# Patient Record
Sex: Female | Born: 1940 | Race: White | Hispanic: No | Marital: Married | Smoking: Never smoker
Health system: Southern US, Community
[De-identification: ages and names within clinical notes are randomized; demographics above are authoritative.]

## PROBLEM LIST (undated history)

## (undated) DIAGNOSIS — N289 Disorder of kidney and ureter, unspecified: Secondary | ICD-10-CM

## (undated) DIAGNOSIS — E119 Type 2 diabetes mellitus without complications: Secondary | ICD-10-CM

## (undated) DIAGNOSIS — K219 Gastro-esophageal reflux disease without esophagitis: Secondary | ICD-10-CM

## (undated) DIAGNOSIS — K760 Fatty (change of) liver, not elsewhere classified: Secondary | ICD-10-CM

## (undated) DIAGNOSIS — T753XXA Motion sickness, initial encounter: Secondary | ICD-10-CM

## (undated) DIAGNOSIS — Z9011 Acquired absence of right breast and nipple: Secondary | ICD-10-CM

## (undated) DIAGNOSIS — R42 Dizziness and giddiness: Secondary | ICD-10-CM

## (undated) DIAGNOSIS — I499 Cardiac arrhythmia, unspecified: Secondary | ICD-10-CM

## (undated) DIAGNOSIS — IMO0002 Reserved for concepts with insufficient information to code with codable children: Secondary | ICD-10-CM

## (undated) DIAGNOSIS — Z923 Personal history of irradiation: Secondary | ICD-10-CM

## (undated) DIAGNOSIS — I10 Essential (primary) hypertension: Secondary | ICD-10-CM

## (undated) DIAGNOSIS — C649 Malignant neoplasm of unspecified kidney, except renal pelvis: Secondary | ICD-10-CM

## (undated) DIAGNOSIS — E7800 Pure hypercholesterolemia, unspecified: Secondary | ICD-10-CM

## (undated) DIAGNOSIS — R160 Hepatomegaly, not elsewhere classified: Secondary | ICD-10-CM

## (undated) DIAGNOSIS — R059 Cough, unspecified: Secondary | ICD-10-CM

## (undated) DIAGNOSIS — K5790 Diverticulosis of intestine, part unspecified, without perforation or abscess without bleeding: Secondary | ICD-10-CM

## (undated) DIAGNOSIS — R05 Cough: Secondary | ICD-10-CM

## (undated) DIAGNOSIS — E215 Disorder of parathyroid gland, unspecified: Secondary | ICD-10-CM

## (undated) DIAGNOSIS — K589 Irritable bowel syndrome without diarrhea: Secondary | ICD-10-CM

## (undated) DIAGNOSIS — Z8719 Personal history of other diseases of the digestive system: Secondary | ICD-10-CM

## (undated) DIAGNOSIS — R002 Palpitations: Secondary | ICD-10-CM

## (undated) DIAGNOSIS — D49519 Neoplasm of unspecified behavior of unspecified kidney: Secondary | ICD-10-CM

## (undated) DIAGNOSIS — C50919 Malignant neoplasm of unspecified site of unspecified female breast: Secondary | ICD-10-CM

## (undated) HISTORY — DX: Disorder of kidney and ureter, unspecified: N28.9

## (undated) HISTORY — DX: Malignant neoplasm of unspecified kidney, except renal pelvis: C64.9

## (undated) HISTORY — DX: Type 2 diabetes mellitus without complications: E11.9

## (undated) HISTORY — PX: APPENDECTOMY: SHX54

## (undated) HISTORY — PX: TONSILLECTOMY: SUR1361

## (undated) HISTORY — DX: Pure hypercholesterolemia, unspecified: E78.00

## (undated) HISTORY — PX: EYE SURGERY: SHX253

## (undated) HISTORY — PX: LEG SURGERY: SHX1003

## (undated) HISTORY — PX: CHOLECYSTECTOMY: SHX55

## (undated) HISTORY — DX: Essential (primary) hypertension: I10

## (undated) HISTORY — DX: Diverticulosis of intestine, part unspecified, without perforation or abscess without bleeding: K57.90

---

## 2003-06-20 HISTORY — PX: OTHER SURGICAL HISTORY: SHX169

## 2005-01-31 ENCOUNTER — Ambulatory Visit: Payer: Self-pay | Admitting: Unknown Physician Specialty

## 2006-01-11 ENCOUNTER — Ambulatory Visit: Payer: Self-pay | Admitting: Family Medicine

## 2007-01-16 ENCOUNTER — Ambulatory Visit: Payer: Self-pay | Admitting: Family Medicine

## 2008-01-28 ENCOUNTER — Ambulatory Visit: Payer: Self-pay | Admitting: Family Medicine

## 2008-02-11 ENCOUNTER — Ambulatory Visit: Payer: Self-pay | Admitting: Family Medicine

## 2009-01-28 ENCOUNTER — Ambulatory Visit: Payer: Self-pay | Admitting: Family Medicine

## 2009-06-08 ENCOUNTER — Ambulatory Visit: Payer: Self-pay | Admitting: Internal Medicine

## 2009-08-25 ENCOUNTER — Ambulatory Visit: Payer: Self-pay | Admitting: Unknown Physician Specialty

## 2010-02-16 ENCOUNTER — Ambulatory Visit: Payer: Self-pay | Admitting: Internal Medicine

## 2010-02-19 ENCOUNTER — Ambulatory Visit: Payer: Self-pay | Admitting: Internal Medicine

## 2010-09-02 ENCOUNTER — Ambulatory Visit: Payer: Self-pay | Admitting: Internal Medicine

## 2010-11-24 ENCOUNTER — Inpatient Hospital Stay: Payer: Self-pay | Admitting: Orthopedic Surgery

## 2011-07-12 ENCOUNTER — Ambulatory Visit: Payer: Self-pay | Admitting: Internal Medicine

## 2012-03-21 DIAGNOSIS — Z923 Personal history of irradiation: Secondary | ICD-10-CM

## 2012-03-21 DIAGNOSIS — C50919 Malignant neoplasm of unspecified site of unspecified female breast: Secondary | ICD-10-CM

## 2012-03-21 DIAGNOSIS — IMO0001 Reserved for inherently not codable concepts without codable children: Secondary | ICD-10-CM

## 2012-03-21 HISTORY — DX: Personal history of irradiation: Z92.3

## 2012-03-21 HISTORY — PX: BREAST LUMPECTOMY: SHX2

## 2012-03-21 HISTORY — DX: Malignant neoplasm of unspecified site of unspecified female breast: C50.919

## 2012-03-21 HISTORY — DX: Reserved for inherently not codable concepts without codable children: IMO0001

## 2012-04-29 ENCOUNTER — Emergency Department: Payer: Self-pay | Admitting: Emergency Medicine

## 2012-05-18 ENCOUNTER — Ambulatory Visit: Payer: Self-pay

## 2012-06-07 ENCOUNTER — Ambulatory Visit: Payer: Self-pay

## 2012-07-16 ENCOUNTER — Ambulatory Visit: Payer: Self-pay

## 2012-07-17 ENCOUNTER — Ambulatory Visit: Payer: Self-pay

## 2012-08-02 ENCOUNTER — Ambulatory Visit: Payer: Self-pay | Admitting: Surgery

## 2012-08-14 ENCOUNTER — Ambulatory Visit: Payer: Self-pay | Admitting: Surgery

## 2012-08-15 LAB — PATHOLOGY REPORT

## 2012-08-20 ENCOUNTER — Ambulatory Visit: Payer: Self-pay | Admitting: Surgery

## 2012-08-20 HISTORY — PX: BREAST EXCISIONAL BIOPSY: SUR124

## 2012-08-23 LAB — PATHOLOGY REPORT

## 2012-09-03 ENCOUNTER — Ambulatory Visit: Payer: Self-pay | Admitting: Oncology

## 2012-09-18 ENCOUNTER — Ambulatory Visit: Payer: Self-pay | Admitting: Oncology

## 2012-09-27 LAB — CBC CANCER CENTER
Basophil #: 0 x10 3/mm (ref 0.0–0.1)
HCT: 42.2 % (ref 35.0–47.0)
HGB: 14.3 g/dL (ref 12.0–16.0)
Lymphocyte #: 1.8 x10 3/mm (ref 1.0–3.6)
MCH: 26.7 pg (ref 26.0–34.0)
Monocyte %: 5.2 %
Neutrophil %: 66.9 %
Platelet: 191 x10 3/mm (ref 150–440)
RBC: 5.35 10*6/uL — ABNORMAL HIGH (ref 3.80–5.20)

## 2012-10-04 LAB — CBC CANCER CENTER
Eosinophil #: 0.2 x10 3/mm (ref 0.0–0.7)
HGB: 14.1 g/dL (ref 12.0–16.0)
MCH: 26.2 pg (ref 26.0–34.0)
MCHC: 32.9 g/dL (ref 32.0–36.0)
MCV: 80 fL (ref 80–100)
Monocyte #: 0.4 x10 3/mm (ref 0.2–0.9)
Neutrophil #: 5 x10 3/mm (ref 1.4–6.5)
RBC: 5.38 10*6/uL — ABNORMAL HIGH (ref 3.80–5.20)
RDW: 16.2 % — ABNORMAL HIGH (ref 11.5–14.5)

## 2012-10-11 LAB — CBC CANCER CENTER
Basophil #: 0 x10 3/mm (ref 0.0–0.1)
Basophil %: 0.5 %
Eosinophil #: 0.2 x10 3/mm (ref 0.0–0.7)
HCT: 43.1 % (ref 35.0–47.0)
Lymphocyte #: 1.6 x10 3/mm (ref 1.0–3.6)
MCHC: 34 g/dL (ref 32.0–36.0)
MCV: 80 fL (ref 80–100)
Monocyte #: 0.4 x10 3/mm (ref 0.2–0.9)
Monocyte %: 5.7 %
Neutrophil #: 5 x10 3/mm (ref 1.4–6.5)
Neutrophil %: 68.3 %
Platelet: 177 x10 3/mm (ref 150–440)
RDW: 16.5 % — ABNORMAL HIGH (ref 11.5–14.5)
WBC: 7.3 x10 3/mm (ref 3.6–11.0)

## 2012-10-18 LAB — CBC CANCER CENTER
Basophil %: 0.5 %
Eosinophil #: 0.3 x10 3/mm (ref 0.0–0.7)
Lymphocyte #: 1.3 x10 3/mm (ref 1.0–3.6)
Lymphocyte %: 19.9 %
MCH: 27 pg (ref 26.0–34.0)
MCV: 80 fL (ref 80–100)
Monocyte %: 6.5 %
RBC: 5.38 10*6/uL — ABNORMAL HIGH (ref 3.80–5.20)
WBC: 6.5 x10 3/mm (ref 3.6–11.0)

## 2012-10-19 ENCOUNTER — Ambulatory Visit: Payer: Self-pay | Admitting: Oncology

## 2012-10-26 LAB — CBC CANCER CENTER
Eosinophil #: 0.2 x10 3/mm (ref 0.0–0.7)
HCT: 43.3 % (ref 35.0–47.0)
HGB: 14.6 g/dL (ref 12.0–16.0)
Lymphocyte #: 1.5 x10 3/mm (ref 1.0–3.6)
Lymphocyte %: 20.5 %
MCH: 27 pg (ref 26.0–34.0)
MCHC: 33.6 g/dL (ref 32.0–36.0)
MCV: 80 fL (ref 80–100)
Monocyte %: 5.3 %
Neutrophil #: 5.2 x10 3/mm (ref 1.4–6.5)
Neutrophil %: 70.8 %
RDW: 16.4 % — ABNORMAL HIGH (ref 11.5–14.5)

## 2012-11-01 LAB — CBC CANCER CENTER
Basophil #: 0.1 x10 3/mm (ref 0.0–0.1)
Basophil %: 0.7 %
HGB: 14.9 g/dL (ref 12.0–16.0)
Lymphocyte %: 18.2 %
MCH: 26.8 pg (ref 26.0–34.0)
MCHC: 33.6 g/dL (ref 32.0–36.0)
MCV: 80 fL (ref 80–100)
Monocyte #: 0.4 x10 3/mm (ref 0.2–0.9)
Monocyte %: 5.9 %
Neutrophil %: 72.7 %
Platelet: 183 x10 3/mm (ref 150–440)
RBC: 5.55 10*6/uL — ABNORMAL HIGH (ref 3.80–5.20)
RDW: 16.4 % — ABNORMAL HIGH (ref 11.5–14.5)

## 2012-11-08 LAB — CBC CANCER CENTER
Basophil #: 0 x10 3/mm (ref 0.0–0.1)
Eosinophil #: 0.2 x10 3/mm (ref 0.0–0.7)
Eosinophil %: 3 %
HCT: 43.7 % (ref 35.0–47.0)
HGB: 14.6 g/dL (ref 12.0–16.0)
Lymphocyte %: 21.5 %
MCH: 26.6 pg (ref 26.0–34.0)
MCHC: 33.3 g/dL (ref 32.0–36.0)
MCV: 80 fL (ref 80–100)
Monocyte #: 0.4 x10 3/mm (ref 0.2–0.9)
Monocyte %: 6.3 %
Neutrophil %: 68.6 %
RBC: 5.47 10*6/uL — ABNORMAL HIGH (ref 3.80–5.20)
RDW: 15.9 % — ABNORMAL HIGH (ref 11.5–14.5)
WBC: 6.5 x10 3/mm (ref 3.6–11.0)

## 2012-11-19 ENCOUNTER — Ambulatory Visit: Payer: Self-pay | Admitting: Oncology

## 2013-02-07 ENCOUNTER — Ambulatory Visit: Payer: Self-pay | Admitting: Oncology

## 2013-02-18 ENCOUNTER — Ambulatory Visit: Payer: Self-pay | Admitting: Oncology

## 2013-04-22 DIAGNOSIS — Z85528 Personal history of other malignant neoplasm of kidney: Secondary | ICD-10-CM | POA: Insufficient documentation

## 2013-05-08 ENCOUNTER — Ambulatory Visit: Payer: Self-pay | Admitting: Radiation Oncology

## 2013-05-08 DIAGNOSIS — Z79810 Long term (current) use of selective estrogen receptor modulators (SERMs): Secondary | ICD-10-CM | POA: Diagnosis not present

## 2013-05-08 DIAGNOSIS — D0590 Unspecified type of carcinoma in situ of unspecified breast: Secondary | ICD-10-CM | POA: Diagnosis not present

## 2013-05-08 DIAGNOSIS — Z170 Estrogen receptor positive status [ER+]: Secondary | ICD-10-CM | POA: Diagnosis not present

## 2013-05-19 ENCOUNTER — Ambulatory Visit: Payer: Self-pay | Admitting: Radiation Oncology

## 2013-06-22 DIAGNOSIS — R21 Rash and other nonspecific skin eruption: Secondary | ICD-10-CM | POA: Diagnosis not present

## 2013-07-03 DIAGNOSIS — L509 Urticaria, unspecified: Secondary | ICD-10-CM | POA: Diagnosis not present

## 2013-07-17 ENCOUNTER — Ambulatory Visit: Payer: Self-pay | Admitting: Oncology

## 2013-07-17 DIAGNOSIS — Z853 Personal history of malignant neoplasm of breast: Secondary | ICD-10-CM | POA: Diagnosis not present

## 2013-07-17 DIAGNOSIS — R922 Inconclusive mammogram: Secondary | ICD-10-CM | POA: Diagnosis not present

## 2013-07-17 DIAGNOSIS — R928 Other abnormal and inconclusive findings on diagnostic imaging of breast: Secondary | ICD-10-CM | POA: Diagnosis not present

## 2013-07-19 ENCOUNTER — Ambulatory Visit: Payer: Self-pay | Admitting: Oncology

## 2013-07-19 DIAGNOSIS — Z79810 Long term (current) use of selective estrogen receptor modulators (SERMs): Secondary | ICD-10-CM | POA: Diagnosis not present

## 2013-07-19 DIAGNOSIS — D0590 Unspecified type of carcinoma in situ of unspecified breast: Secondary | ICD-10-CM | POA: Diagnosis not present

## 2013-07-24 DIAGNOSIS — D0590 Unspecified type of carcinoma in situ of unspecified breast: Secondary | ICD-10-CM | POA: Diagnosis not present

## 2013-08-19 ENCOUNTER — Ambulatory Visit: Payer: Self-pay | Admitting: Oncology

## 2013-08-23 DIAGNOSIS — H353190 Nonexudative age-related macular degeneration, unspecified eye, stage unspecified: Secondary | ICD-10-CM | POA: Diagnosis not present

## 2013-10-03 ENCOUNTER — Ambulatory Visit: Payer: Self-pay | Admitting: Oncology

## 2013-10-04 DIAGNOSIS — D0590 Unspecified type of carcinoma in situ of unspecified breast: Secondary | ICD-10-CM | POA: Diagnosis not present

## 2013-10-08 DIAGNOSIS — E119 Type 2 diabetes mellitus without complications: Secondary | ICD-10-CM | POA: Diagnosis not present

## 2013-10-08 DIAGNOSIS — E785 Hyperlipidemia, unspecified: Secondary | ICD-10-CM | POA: Diagnosis not present

## 2013-10-08 DIAGNOSIS — Z0000 Encounter for general adult medical examination without abnormal findings: Secondary | ICD-10-CM | POA: Diagnosis not present

## 2013-10-08 DIAGNOSIS — I10 Essential (primary) hypertension: Secondary | ICD-10-CM | POA: Diagnosis not present

## 2013-10-16 DIAGNOSIS — K5730 Diverticulosis of large intestine without perforation or abscess without bleeding: Secondary | ICD-10-CM | POA: Diagnosis not present

## 2013-10-16 DIAGNOSIS — E785 Hyperlipidemia, unspecified: Secondary | ICD-10-CM | POA: Diagnosis not present

## 2013-10-16 DIAGNOSIS — Z853 Personal history of malignant neoplasm of breast: Secondary | ICD-10-CM | POA: Diagnosis not present

## 2013-10-16 DIAGNOSIS — Z0000 Encounter for general adult medical examination without abnormal findings: Secondary | ICD-10-CM | POA: Diagnosis not present

## 2013-10-16 DIAGNOSIS — N183 Chronic kidney disease, stage 3 unspecified: Secondary | ICD-10-CM | POA: Diagnosis not present

## 2013-11-09 DIAGNOSIS — J069 Acute upper respiratory infection, unspecified: Secondary | ICD-10-CM | POA: Diagnosis not present

## 2013-11-29 ENCOUNTER — Ambulatory Visit: Payer: Self-pay | Admitting: Oncology

## 2013-11-29 DIAGNOSIS — Z833 Family history of diabetes mellitus: Secondary | ICD-10-CM | POA: Diagnosis not present

## 2013-11-29 DIAGNOSIS — D0590 Unspecified type of carcinoma in situ of unspecified breast: Secondary | ICD-10-CM | POA: Diagnosis not present

## 2013-11-29 DIAGNOSIS — R209 Unspecified disturbances of skin sensation: Secondary | ICD-10-CM | POA: Diagnosis not present

## 2013-11-29 DIAGNOSIS — E119 Type 2 diabetes mellitus without complications: Secondary | ICD-10-CM | POA: Diagnosis not present

## 2013-11-29 DIAGNOSIS — Z9089 Acquired absence of other organs: Secondary | ICD-10-CM | POA: Diagnosis not present

## 2013-11-29 DIAGNOSIS — N289 Disorder of kidney and ureter, unspecified: Secondary | ICD-10-CM | POA: Diagnosis not present

## 2013-11-29 DIAGNOSIS — E785 Hyperlipidemia, unspecified: Secondary | ICD-10-CM | POA: Diagnosis not present

## 2013-11-29 DIAGNOSIS — I10 Essential (primary) hypertension: Secondary | ICD-10-CM | POA: Diagnosis not present

## 2013-11-29 DIAGNOSIS — K5730 Diverticulosis of large intestine without perforation or abscess without bleeding: Secondary | ICD-10-CM | POA: Diagnosis not present

## 2013-11-29 DIAGNOSIS — Z905 Acquired absence of kidney: Secondary | ICD-10-CM | POA: Diagnosis not present

## 2013-11-29 DIAGNOSIS — Z8553 Personal history of malignant neoplasm of renal pelvis: Secondary | ICD-10-CM | POA: Diagnosis not present

## 2013-11-29 DIAGNOSIS — Z79810 Long term (current) use of selective estrogen receptor modulators (SERMs): Secondary | ICD-10-CM | POA: Diagnosis not present

## 2013-11-29 DIAGNOSIS — Z79899 Other long term (current) drug therapy: Secondary | ICD-10-CM | POA: Diagnosis not present

## 2013-12-02 DIAGNOSIS — Z79899 Other long term (current) drug therapy: Secondary | ICD-10-CM | POA: Diagnosis not present

## 2013-12-02 DIAGNOSIS — I10 Essential (primary) hypertension: Secondary | ICD-10-CM | POA: Diagnosis not present

## 2013-12-02 DIAGNOSIS — D0590 Unspecified type of carcinoma in situ of unspecified breast: Secondary | ICD-10-CM | POA: Diagnosis not present

## 2013-12-02 DIAGNOSIS — Z79810 Long term (current) use of selective estrogen receptor modulators (SERMs): Secondary | ICD-10-CM | POA: Diagnosis not present

## 2013-12-02 DIAGNOSIS — E119 Type 2 diabetes mellitus without complications: Secondary | ICD-10-CM | POA: Diagnosis not present

## 2013-12-02 DIAGNOSIS — R209 Unspecified disturbances of skin sensation: Secondary | ICD-10-CM | POA: Diagnosis not present

## 2013-12-19 ENCOUNTER — Ambulatory Visit: Payer: Self-pay | Admitting: Oncology

## 2013-12-23 DIAGNOSIS — H35341 Macular cyst, hole, or pseudohole, right eye: Secondary | ICD-10-CM | POA: Diagnosis not present

## 2014-01-01 DIAGNOSIS — N816 Rectocele: Secondary | ICD-10-CM | POA: Insufficient documentation

## 2014-01-01 DIAGNOSIS — Z859 Personal history of malignant neoplasm, unspecified: Secondary | ICD-10-CM | POA: Diagnosis not present

## 2014-01-01 DIAGNOSIS — Z01419 Encounter for gynecological examination (general) (routine) without abnormal findings: Secondary | ICD-10-CM | POA: Diagnosis not present

## 2014-01-08 DIAGNOSIS — H35341 Macular cyst, hole, or pseudohole, right eye: Secondary | ICD-10-CM | POA: Diagnosis not present

## 2014-01-10 ENCOUNTER — Ambulatory Visit: Payer: Self-pay | Admitting: Ophthalmology

## 2014-01-10 DIAGNOSIS — Z1211 Encounter for screening for malignant neoplasm of colon: Secondary | ICD-10-CM | POA: Diagnosis not present

## 2014-01-10 DIAGNOSIS — Z853 Personal history of malignant neoplasm of breast: Secondary | ICD-10-CM | POA: Diagnosis not present

## 2014-01-10 DIAGNOSIS — E780 Pure hypercholesterolemia: Secondary | ICD-10-CM | POA: Diagnosis not present

## 2014-01-10 DIAGNOSIS — K219 Gastro-esophageal reflux disease without esophagitis: Secondary | ICD-10-CM | POA: Diagnosis not present

## 2014-01-10 DIAGNOSIS — Z01810 Encounter for preprocedural cardiovascular examination: Secondary | ICD-10-CM | POA: Diagnosis not present

## 2014-01-10 DIAGNOSIS — E119 Type 2 diabetes mellitus without complications: Secondary | ICD-10-CM | POA: Diagnosis not present

## 2014-01-10 DIAGNOSIS — N183 Chronic kidney disease, stage 3 (moderate): Secondary | ICD-10-CM | POA: Diagnosis not present

## 2014-01-10 DIAGNOSIS — I10 Essential (primary) hypertension: Secondary | ICD-10-CM | POA: Diagnosis not present

## 2014-01-10 DIAGNOSIS — J209 Acute bronchitis, unspecified: Secondary | ICD-10-CM | POA: Diagnosis not present

## 2014-01-10 DIAGNOSIS — Z01812 Encounter for preprocedural laboratory examination: Secondary | ICD-10-CM | POA: Diagnosis not present

## 2014-01-10 DIAGNOSIS — H35341 Macular cyst, hole, or pseudohole, right eye: Secondary | ICD-10-CM | POA: Diagnosis not present

## 2014-01-10 LAB — POTASSIUM: Potassium: 4.1 mmol/L (ref 3.5–5.1)

## 2014-01-15 ENCOUNTER — Ambulatory Visit: Payer: Self-pay | Admitting: Ophthalmology

## 2014-01-15 DIAGNOSIS — Z9011 Acquired absence of right breast and nipple: Secondary | ICD-10-CM | POA: Diagnosis not present

## 2014-01-15 DIAGNOSIS — H35341 Macular cyst, hole, or pseudohole, right eye: Secondary | ICD-10-CM | POA: Diagnosis not present

## 2014-01-15 DIAGNOSIS — K449 Diaphragmatic hernia without obstruction or gangrene: Secondary | ICD-10-CM | POA: Diagnosis not present

## 2014-01-15 DIAGNOSIS — Z79899 Other long term (current) drug therapy: Secondary | ICD-10-CM | POA: Diagnosis not present

## 2014-01-15 DIAGNOSIS — Z885 Allergy status to narcotic agent status: Secondary | ICD-10-CM | POA: Diagnosis not present

## 2014-01-15 DIAGNOSIS — E119 Type 2 diabetes mellitus without complications: Secondary | ICD-10-CM | POA: Diagnosis not present

## 2014-01-15 DIAGNOSIS — I10 Essential (primary) hypertension: Secondary | ICD-10-CM | POA: Diagnosis not present

## 2014-01-15 DIAGNOSIS — Z888 Allergy status to other drugs, medicaments and biological substances status: Secondary | ICD-10-CM | POA: Diagnosis not present

## 2014-01-15 DIAGNOSIS — K219 Gastro-esophageal reflux disease without esophagitis: Secondary | ICD-10-CM | POA: Diagnosis not present

## 2014-01-15 DIAGNOSIS — Z8553 Personal history of malignant neoplasm of renal pelvis: Secondary | ICD-10-CM | POA: Diagnosis not present

## 2014-01-15 DIAGNOSIS — Z853 Personal history of malignant neoplasm of breast: Secondary | ICD-10-CM | POA: Diagnosis not present

## 2014-01-20 ENCOUNTER — Ambulatory Visit: Payer: Self-pay | Admitting: Oncology

## 2014-01-20 DIAGNOSIS — R921 Mammographic calcification found on diagnostic imaging of breast: Secondary | ICD-10-CM | POA: Diagnosis not present

## 2014-01-20 DIAGNOSIS — R928 Other abnormal and inconclusive findings on diagnostic imaging of breast: Secondary | ICD-10-CM | POA: Diagnosis not present

## 2014-01-24 ENCOUNTER — Ambulatory Visit: Payer: Self-pay | Admitting: Oncology

## 2014-01-24 DIAGNOSIS — Z79899 Other long term (current) drug therapy: Secondary | ICD-10-CM | POA: Diagnosis not present

## 2014-01-24 DIAGNOSIS — E119 Type 2 diabetes mellitus without complications: Secondary | ICD-10-CM | POA: Diagnosis not present

## 2014-01-24 DIAGNOSIS — Z1231 Encounter for screening mammogram for malignant neoplasm of breast: Secondary | ICD-10-CM | POA: Diagnosis not present

## 2014-01-24 DIAGNOSIS — Z905 Acquired absence of kidney: Secondary | ICD-10-CM | POA: Diagnosis not present

## 2014-01-24 DIAGNOSIS — K5790 Diverticulosis of intestine, part unspecified, without perforation or abscess without bleeding: Secondary | ICD-10-CM | POA: Diagnosis not present

## 2014-01-24 DIAGNOSIS — Z23 Encounter for immunization: Secondary | ICD-10-CM | POA: Diagnosis not present

## 2014-01-24 DIAGNOSIS — Z85528 Personal history of other malignant neoplasm of kidney: Secondary | ICD-10-CM | POA: Diagnosis not present

## 2014-01-24 DIAGNOSIS — N189 Chronic kidney disease, unspecified: Secondary | ICD-10-CM | POA: Diagnosis not present

## 2014-01-24 DIAGNOSIS — Z79810 Long term (current) use of selective estrogen receptor modulators (SERMs): Secondary | ICD-10-CM | POA: Diagnosis not present

## 2014-01-24 DIAGNOSIS — D0591 Unspecified type of carcinoma in situ of right breast: Secondary | ICD-10-CM | POA: Diagnosis not present

## 2014-01-24 DIAGNOSIS — I129 Hypertensive chronic kidney disease with stage 1 through stage 4 chronic kidney disease, or unspecified chronic kidney disease: Secondary | ICD-10-CM | POA: Diagnosis not present

## 2014-01-24 DIAGNOSIS — Z9049 Acquired absence of other specified parts of digestive tract: Secondary | ICD-10-CM | POA: Diagnosis not present

## 2014-02-18 ENCOUNTER — Ambulatory Visit: Payer: Self-pay | Admitting: Oncology

## 2014-02-21 DIAGNOSIS — H35341 Macular cyst, hole, or pseudohole, right eye: Secondary | ICD-10-CM | POA: Diagnosis not present

## 2014-03-27 ENCOUNTER — Ambulatory Visit: Payer: Self-pay | Admitting: Oncology

## 2014-03-27 DIAGNOSIS — Z1231 Encounter for screening mammogram for malignant neoplasm of breast: Secondary | ICD-10-CM | POA: Diagnosis not present

## 2014-03-27 DIAGNOSIS — E785 Hyperlipidemia, unspecified: Secondary | ICD-10-CM | POA: Diagnosis not present

## 2014-03-27 DIAGNOSIS — Z79810 Long term (current) use of selective estrogen receptor modulators (SERMs): Secondary | ICD-10-CM | POA: Diagnosis not present

## 2014-03-27 DIAGNOSIS — Z9049 Acquired absence of other specified parts of digestive tract: Secondary | ICD-10-CM | POA: Diagnosis not present

## 2014-03-27 DIAGNOSIS — N189 Chronic kidney disease, unspecified: Secondary | ICD-10-CM | POA: Diagnosis not present

## 2014-03-27 DIAGNOSIS — K5790 Diverticulosis of intestine, part unspecified, without perforation or abscess without bleeding: Secondary | ICD-10-CM | POA: Diagnosis not present

## 2014-03-27 DIAGNOSIS — Z85528 Personal history of other malignant neoplasm of kidney: Secondary | ICD-10-CM | POA: Diagnosis not present

## 2014-03-27 DIAGNOSIS — I10 Essential (primary) hypertension: Secondary | ICD-10-CM | POA: Diagnosis not present

## 2014-03-27 DIAGNOSIS — E119 Type 2 diabetes mellitus without complications: Secondary | ICD-10-CM | POA: Diagnosis not present

## 2014-03-27 DIAGNOSIS — D0510 Intraductal carcinoma in situ of unspecified breast: Secondary | ICD-10-CM | POA: Diagnosis not present

## 2014-03-27 DIAGNOSIS — I129 Hypertensive chronic kidney disease with stage 1 through stage 4 chronic kidney disease, or unspecified chronic kidney disease: Secondary | ICD-10-CM | POA: Diagnosis not present

## 2014-03-27 DIAGNOSIS — N63 Unspecified lump in breast: Secondary | ICD-10-CM | POA: Diagnosis not present

## 2014-03-27 DIAGNOSIS — Z905 Acquired absence of kidney: Secondary | ICD-10-CM | POA: Diagnosis not present

## 2014-04-10 DIAGNOSIS — N63 Unspecified lump in breast: Secondary | ICD-10-CM | POA: Diagnosis not present

## 2014-04-10 DIAGNOSIS — Z853 Personal history of malignant neoplasm of breast: Secondary | ICD-10-CM | POA: Diagnosis not present

## 2014-04-10 DIAGNOSIS — Z79810 Long term (current) use of selective estrogen receptor modulators (SERMs): Secondary | ICD-10-CM | POA: Diagnosis not present

## 2014-04-10 DIAGNOSIS — N6459 Other signs and symptoms in breast: Secondary | ICD-10-CM | POA: Diagnosis not present

## 2014-04-10 DIAGNOSIS — E785 Hyperlipidemia, unspecified: Secondary | ICD-10-CM | POA: Diagnosis not present

## 2014-04-10 DIAGNOSIS — D0510 Intraductal carcinoma in situ of unspecified breast: Secondary | ICD-10-CM | POA: Diagnosis not present

## 2014-04-10 DIAGNOSIS — E119 Type 2 diabetes mellitus without complications: Secondary | ICD-10-CM | POA: Diagnosis not present

## 2014-04-10 DIAGNOSIS — I10 Essential (primary) hypertension: Secondary | ICD-10-CM | POA: Diagnosis not present

## 2014-04-16 DIAGNOSIS — E119 Type 2 diabetes mellitus without complications: Secondary | ICD-10-CM | POA: Diagnosis not present

## 2014-04-16 DIAGNOSIS — D0591 Unspecified type of carcinoma in situ of right breast: Secondary | ICD-10-CM | POA: Diagnosis not present

## 2014-04-16 DIAGNOSIS — I10 Essential (primary) hypertension: Secondary | ICD-10-CM | POA: Diagnosis not present

## 2014-04-16 DIAGNOSIS — N63 Unspecified lump in breast: Secondary | ICD-10-CM | POA: Diagnosis not present

## 2014-04-16 DIAGNOSIS — E785 Hyperlipidemia, unspecified: Secondary | ICD-10-CM | POA: Diagnosis not present

## 2014-04-16 DIAGNOSIS — D0510 Intraductal carcinoma in situ of unspecified breast: Secondary | ICD-10-CM | POA: Diagnosis not present

## 2014-04-16 DIAGNOSIS — Z79810 Long term (current) use of selective estrogen receptor modulators (SERMs): Secondary | ICD-10-CM | POA: Diagnosis not present

## 2014-04-21 ENCOUNTER — Ambulatory Visit: Payer: Self-pay | Admitting: Oncology

## 2014-04-29 DIAGNOSIS — N183 Chronic kidney disease, stage 3 (moderate): Secondary | ICD-10-CM | POA: Diagnosis not present

## 2014-04-29 DIAGNOSIS — K5790 Diverticulosis of intestine, part unspecified, without perforation or abscess without bleeding: Secondary | ICD-10-CM | POA: Diagnosis not present

## 2014-04-29 DIAGNOSIS — Z853 Personal history of malignant neoplasm of breast: Secondary | ICD-10-CM | POA: Diagnosis not present

## 2014-04-29 DIAGNOSIS — E119 Type 2 diabetes mellitus without complications: Secondary | ICD-10-CM | POA: Diagnosis not present

## 2014-04-29 DIAGNOSIS — E785 Hyperlipidemia, unspecified: Secondary | ICD-10-CM | POA: Diagnosis not present

## 2014-05-06 DIAGNOSIS — I10 Essential (primary) hypertension: Secondary | ICD-10-CM | POA: Diagnosis not present

## 2014-05-06 DIAGNOSIS — E119 Type 2 diabetes mellitus without complications: Secondary | ICD-10-CM | POA: Diagnosis not present

## 2014-05-06 DIAGNOSIS — E785 Hyperlipidemia, unspecified: Secondary | ICD-10-CM | POA: Diagnosis not present

## 2014-06-06 DIAGNOSIS — R05 Cough: Secondary | ICD-10-CM | POA: Diagnosis not present

## 2014-06-06 DIAGNOSIS — B354 Tinea corporis: Secondary | ICD-10-CM | POA: Diagnosis not present

## 2014-06-20 DIAGNOSIS — H35341 Macular cyst, hole, or pseudohole, right eye: Secondary | ICD-10-CM | POA: Diagnosis not present

## 2014-06-20 DIAGNOSIS — H3531 Nonexudative age-related macular degeneration: Secondary | ICD-10-CM | POA: Diagnosis not present

## 2014-07-04 ENCOUNTER — Other Ambulatory Visit: Payer: Self-pay | Admitting: Oncology

## 2014-07-04 DIAGNOSIS — R928 Other abnormal and inconclusive findings on diagnostic imaging of breast: Secondary | ICD-10-CM

## 2014-07-04 DIAGNOSIS — Z853 Personal history of malignant neoplasm of breast: Secondary | ICD-10-CM

## 2014-07-11 DIAGNOSIS — H35341 Macular cyst, hole, or pseudohole, right eye: Secondary | ICD-10-CM | POA: Diagnosis not present

## 2014-07-11 NOTE — Op Note (Signed)
PATIENT NAME:  Gail Mccarthy, Gail Mccarthy MR#:  [id number] DATE OF BIRTH:  31-Aug-1940  DATE OF PROCEDURE:  08/20/2012  PREOPERATIVE DIAGNOSIS: Ductal carcinoma in situ of the right breast.   POSTOPERATIVE DIAGNOSIS: Ductal carcinoma in situ of the right breast.   PROCEDURE: Right partial mastectomy.   SURGEON: Rochel Brome, M.D.   ANESTHESIA: General.   INDICATIONS: This 74 year old female recently had a mammogram detecting a small cluster of microcalcifications in the medial aspect of the right breast. She had stereotactic needle biopsy, which demonstrated ductal carcinoma in situ. She had preoperative insertion of a Kopans wire. I have reviewed the mammogram images with the Kopans wire in place.  DESCRIPTION OF PROCEDURE: The patient was placed on the operating table in the supine position under general anesthesia. The dressing was removed from the right breast, exposing the Kopans wire which was cut 2 cm from the skin. The Kopans wire entered the skin at the 3 o'clock position of the right breast. The breast was prepared with ChloraPrep and draped in a sterile manner.   Mammogram images were reviewed prior to incision.   A curvilinear incision was made at the 3 o'clock position of the right breast, extending from 2 o'clock  to 4 o'clock position, as it was midway between the medial border of the areola and the sternum. The incision was carried down through subcutaneous tissues. I did excise a narrow ellipse of skin, which was the biopsy scar, and extended down to encounter the wire. Next, a sample of tissue surrounding the wire was removed, which was approximately 4 x 4 x 4 cm in dimension and was labeled with MarginMap to insert 2 suture markers to the tissue. Also, the 4 o'clock end of the skin ellipse was tagged with a nylon stitch. The Kopans wire was left intact.   This was sent for specimen mammogram and from there for pathology to check for margins.   The wound was inspected. Several small  bleeding points were cauterized. Hemostasis was subsequently intact. Deeper tissues were infiltrated with 0.5% Sensorcaine with epinephrine, and then subcutaneous tissues were infiltrated as well. The subcutaneous tissues were closed with interrupted 4-0 chromic, and the skin was closed with a running 4-0 Monocryl subcuticular suture.   I waited for the pathologist's call and the pathologist reported margins appeared to be clear, and subsequently the wound was treated with Dermabond and allowed to dry. The patient was then prepared for transfer to the recovery room.    ____________________________ [name]. Rochel Brome, MD [email or web address] D: 08/20/2012 13:26:04 ET T: 08/20/2012 14:48:39 ET JOB#: [id number]  cc: Loreli Dollar, MD, <Dictator> Loreli Dollar MD ELECTRONICALLY SIGNED 08/24/2012 9:30

## 2014-07-11 NOTE — Consult Note (Signed)
Reason for Visit: This 74 year old Female patient presents to the clinic for initial evaluation of  breast cancer .   Referred by Dr. Grayland Ormond.  Diagnosis:  Chief Complaint/Diagnosis   30-year-old female status post wide local excision of the right breast for stage 0 (Tis N0 M0) ductal carcinoma in situ ER/PR positive with clear but close margins.  Pathology Report pathology report reviewed   Imaging Report mammograms reviewed   Referral Report clinical notes reviewed   Planned Treatment Regimen whole breast radiation plus high-dose boost   HPI   patient is a 74 year old femalewho presents with a small nodular density with microcalcifications patient in the right breastwhich was a BI-RADS for suggesting surgical consultation. Underwent needle localization positive for ductal carcinoma in situ.tumor was overall grade 2 and1.6 cm with clear but close margins at 0.2 mm. Tumor was strongly ER/PR positive. Patient tolerated her surgery well. Has been seen by medical oncology will be placed on tamoxifen after radiation. She is seen today and is doing well. She specifically denies breast tenderness cough or bone pain.  Past Hx:    GERD - Esophageal Reflux:    Diverticulitis:    HTN:    Borderline DM:    Cholecystectomy:    Kidney removed: 7 years ago  Past, Family and Social History:  Past Medical History positive   Cardiovascular hyperlipidemia; hypertension   Gastrointestinal diverticulitis; diverticulosis   Genitourinary chronic renal disease   Endocrine diabetes mellitus   Past Surgical History appendectomy; cholecystectomy; right nephrectomy 2005 for renal cell carcinoma   Family History positive   Family History Comments family history of else onset diabetes COPD and colitis.   Social History noncontributory   Additional Past Medical and Surgical History accompanied by her husband today   Allergies:   Codeine: N/V/Diarrhea  Omnicef Omni-Pac:  N/V/Diarrhea  Latex: Rash  Tape: Other  Home Meds:  Home Medications: Medication Instructions Status  benazepril 10 mg oral tablet 1 tab(s) orally once a day in am Active  ALPRAZolam 0.5 mg oral tablet 1 tab(s) orally 3 times a day, As Needed Active  hydrochlorothiazide-triamterene 25 mg-37.5 mg oral tablet 1 tab(s) orally once a day in am Active  pantoprazole 40 mg oral delayed release tablet 1 tab(s) orally once a day, As Needed Active  Tylenol Extra Strength 500 mg oral tablet 2 tab(s) orally as needed for pain. Active   Review of Systems:  General negative   Performance Status (ECOG) 0   Skin negative   Breast see HPI   Ophthalmologic negative   ENMT negative   Respiratory and Thorax negative   Cardiovascular negative   Gastrointestinal negative   Genitourinary negative   Musculoskeletal negative   Neurological negative   Psychiatric negative   Hematology/Lymphatics negative   Endocrine negative   Allergic/Immunologic negative   Review of Systems   according to the nurse's notesPatient denies any weight loss, fatigue, weakness, fever, chills or night sweats. Patient denies any loss of vision, blurred vision. Patient denies any ringing  of the ears or hearing loss. No irregular heartbeat. Patient denies heart murmur or history of fainting. Patient denies any chest pain or pain radiating to her upper extremities. Patient denies any shortness of breath, difficulty breathing at night, cough or hemoptysis. Patient denies any swelling in the lower legs. Patient denies any nausea vomiting, vomiting of blood, or coffee ground material in the vomitus. Patient denies any stomach pain. Patient states has had normal bowel movements no significant constipation or  diarrhea. Patient denies any dysuria, hematuria or significant nocturia. Patient denies any problems walking, swelling in the joints or loss of balance. Patient denies any skin changes, loss of hair or loss of weight.  Patient denies any excessive worrying or anxiety or significant depression. Patient denies any problems with insomnia. Patient denies excessive thirst, polyuria, polydipsia. Patient denies any swollen glands, patient denies easy bruising or easy bleeding. Patient denies any recent infections, allergies or URI. Patient "s visual fields have not changed significantly in recent time.  Nursing Notes:  Nursing Vital Signs and Chemo Nursing Nursing Notes: *CC Vital Signs Flowsheet:   16-Jun-14 09:40  Temp Temperature 95.6  Pulse Pulse 58  Respirations Respirations 16  SBP SBP 149  DBP DBP 79  Pain Scale (0-10)  0  Current Weight (kg) (kg) 105.8  Height (cm) centimeters 165  BSA (m2) 2.1   Physical Exam:  General/Skin/HEENT:  General normal   Skin normal   Eyes normal   ENMT normal   Head and Neck normal   Additional PE well-developed obese female in NAD. Lungs are clear to A&P cardiac examination shows regular rate and rhythm. Right breasthas a wide local excision site which is healing well. No dominant mass or nodularity is noted in either breast into position examined. No axillary or supraclavicular adenopathy is appreciated. Lungs are clear to A&P cardiac examination shows regular rate and rhythm.   Breasts/Resp/CV/GI/GU:  Respiratory and Thorax normal   Cardiovascular normal   Gastrointestinal normal   Genitourinary normal   MS/Neuro/Psych/Lymph:  Musculoskeletal normal   Neurological normal   Lymphatics normal   Other Results:  Radiology Results: LabUnknown:    29-Apr-14 13:32, Digital Additional Views Rt Breast (SCR)  PACS Image   [facility]:  Digital Additional Views Rt Breast (SCR)   REASON FOR EXAM:    av rt nodular density w/microcals  COMMENTS:       PROCEDURE: MAM - MAM DIG ADDVIEWS RT SCR  - Jul 17 2012  1:32PM     RESULT: Comparison made to prior studies dating 01/28/2008. Nodular   density in the inferior medial portion right breast is present.  Surgical   consultation suggested to consider removal. Subtle malignancy cannot be   excluded.    IMPRESSION:  Small nodular density with microcavitation medial right   breast for which surgical consultation suggested as tiny malignancy could   present in this fashion. BI-RADS: Category 4 - Suspicious Abnormality -   Biopsy Should Be Considered  Thank you for the oppurtunity to contribute to the care of your patient.   . may decrease the sensitivity of mammography.     .        Verified By: Osa Craver, M.D., MD   Assessment and Plan: Impression:   ductal carcinoma in situ ER/PR positive status post wide local excision of the right breast in 7-year-old female. Plan:   at the stomach to go ahead with radiation therapy to her right breast. Would plan on delivering 5000 cGy over 5 weeks. Based on extremely close scar would also boost or scar region another 2000 cGy using electron beam. Risks and benefits of treatment were reviewed with the patient and her husband. Side effects including redness itching of the skin, some inclusion of underlying lung parenchyma, alteration of blood counts, and fatigue or lower explained in detail to the patient. They both seem to comprehend my treatment plan well. I have set him up for CT simulation later this week.  I  would like to take this opportunity to thank you for allowing me to continue to participate in this patient's care.  CC Referral:  cc: Dr. Tamala Julian, Dr. Adrian Prows   Electronic Signatures: Baruch Gouty, Roda Shutters (MD)  (Signed 16-Jun-14 15:05)  Authored: HPI, Diagnosis, Past Hx, PFSH, Allergies, Home Meds, ROS, Nursing Notes, Physical Exam, Other Results, Encounter Assessment and Plan, CC Referring Physician   Last Updated: 16-Jun-14 15:05 by Armstead Peaks (MD)

## 2014-07-12 NOTE — Op Note (Signed)
PATIENT NAME:  Gail Mccarthy, Gail Mccarthy MR#:  [id number] DATE OF BIRTH:  June 17, 1940  DATE OF PROCEDURE:  01/15/2014  PREOPERATIVE DIAGNOSIS:  Macular hole, right eye.   POSTOPERATIVE DIAGNOSIS:  Macular hole, right eye.   PROCEDURE:  A 25-gauge pars plana vitrectomy with triamcinolone ICG membrane peel, endolaser, air-fluid exchange, and 24% SF6.   SURGEON:  Laban Emperor. Oval Linsey, MD  COMPLICATIONS:  None.   BLOOD LOSS:  Minimal.   ANESTHESIA:  General.   PROCEDURE NOTE:  The patient was evaluated in the clinic for a macular hole in the right eye. The risks, benefits, alternatives, and complications were discussed. The patient elected to proceed with pars plana vitrectomy with membrane peel on the right eye. On the day of surgery, the patient was greeted in the preoperative holding area. Any questions were answered, and the right eye was marked. The patient was brought in to the operating room in supine position. General anesthesia was administered. The right eye was prepped and draped in the usual sterile fashion. Three 25-gauge trocars were placed in the usual position. The infusion cannula was checked prior to starting the infusion. A core vitrectomy was performed followed by staining the posterior hyaloid with triamcinolone. The posterior hyaloid was then separated, and the remainder of the vitrectomy was performed into the periphery for 360 degrees. ICG was then applied to the retinal surface and aspirated. ILM forceps were then used to peel the ILM for an area of about 2 disc diameters around the macular hole. The periphery was then inspected for 360 degrees using a scleral depressor and the wide-angle lens. There was a small tear found superiorly at 11:00, which was barricaded well with laser, which was extended down past the trocar site on the temporal side. An air-fluid exchange was then performed followed by 4 times the vitreous volume exchange of 24% SF6. The trocars were then removed, and wounds  were sutured and felt to be airtight. The speculum was removed, and the patient was patched and shielded with Neo-Poly-Dex ointment, brought out from general anesthesia, and taken to the recovery area in stable condition.    ____________________________ Laban Emperor Oval Linsey, MD jdr:nb D: 01/15/2014 18:04:43 ET T: 01/15/2014 23:52:56 ET JOB#: [id number]  cc: Janett Billow D. Oval Linsey, MD, <Dictator> Mikaelah Trostle D [facility] MD ELECTRONICALLY SIGNED 01/22/2014 9:02

## 2014-07-21 ENCOUNTER — Ambulatory Visit: Payer: Medicare Other

## 2014-07-21 ENCOUNTER — Ambulatory Visit
Admission: RE | Admit: 2014-07-21 | Discharge: 2014-07-21 | Disposition: A | Discharge status: Home / Self Care | Payer: Medicare Other | Source: Ambulatory Visit | Attending: Oncology | Admitting: Oncology

## 2014-07-21 DIAGNOSIS — R928 Other abnormal and inconclusive findings on diagnostic imaging of breast: Secondary | ICD-10-CM

## 2014-07-21 DIAGNOSIS — Z9889 Other specified postprocedural states: Secondary | ICD-10-CM | POA: Diagnosis not present

## 2014-07-21 DIAGNOSIS — Z853 Personal history of malignant neoplasm of breast: Secondary | ICD-10-CM

## 2014-07-21 HISTORY — DX: Malignant neoplasm of unspecified site of unspecified female breast: C50.919

## 2014-07-21 HISTORY — DX: Reserved for concepts with insufficient information to code with codable children: IMO0002

## 2014-07-24 DIAGNOSIS — H34211 Partial retinal artery occlusion, right eye: Secondary | ICD-10-CM | POA: Diagnosis not present

## 2014-07-30 DIAGNOSIS — H34213 Partial retinal artery occlusion, bilateral: Secondary | ICD-10-CM | POA: Insufficient documentation

## 2014-08-28 ENCOUNTER — Inpatient Hospital Stay: Payer: Medicare Other | Attending: Oncology | Admitting: Oncology

## 2014-08-28 ENCOUNTER — Encounter: Payer: Self-pay | Admitting: Oncology

## 2014-08-28 ENCOUNTER — Encounter (INDEPENDENT_AMBULATORY_CARE_PROVIDER_SITE_OTHER): Payer: Self-pay

## 2014-08-28 VITALS — BP 118/64 | HR 65 | Temp 97.3°F | Resp 20 | Ht 64.96 in | Wt 224.4 lb

## 2014-08-28 DIAGNOSIS — E780 Pure hypercholesterolemia: Secondary | ICD-10-CM | POA: Diagnosis not present

## 2014-08-28 DIAGNOSIS — Z79810 Long term (current) use of selective estrogen receptor modulators (SERMs): Secondary | ICD-10-CM | POA: Insufficient documentation

## 2014-08-28 DIAGNOSIS — D0511 Intraductal carcinoma in situ of right breast: Secondary | ICD-10-CM

## 2014-08-28 DIAGNOSIS — Z85528 Personal history of other malignant neoplasm of kidney: Secondary | ICD-10-CM | POA: Diagnosis not present

## 2014-08-28 DIAGNOSIS — Z7982 Long term (current) use of aspirin: Secondary | ICD-10-CM | POA: Insufficient documentation

## 2014-08-28 DIAGNOSIS — Z9049 Acquired absence of other specified parts of digestive tract: Secondary | ICD-10-CM | POA: Diagnosis not present

## 2014-08-28 DIAGNOSIS — Z79899 Other long term (current) drug therapy: Secondary | ICD-10-CM | POA: Insufficient documentation

## 2014-08-28 DIAGNOSIS — Z803 Family history of malignant neoplasm of breast: Secondary | ICD-10-CM | POA: Insufficient documentation

## 2014-08-28 DIAGNOSIS — Z170 Estrogen receptor positive status [ER+]: Secondary | ICD-10-CM | POA: Diagnosis not present

## 2014-08-28 DIAGNOSIS — K5790 Diverticulosis of intestine, part unspecified, without perforation or abscess without bleeding: Secondary | ICD-10-CM | POA: Diagnosis not present

## 2014-09-03 DIAGNOSIS — K219 Gastro-esophageal reflux disease without esophagitis: Secondary | ICD-10-CM | POA: Diagnosis not present

## 2014-09-03 DIAGNOSIS — Z8719 Personal history of other diseases of the digestive system: Secondary | ICD-10-CM | POA: Diagnosis not present

## 2014-09-03 DIAGNOSIS — Z86010 Personal history of colonic polyps: Secondary | ICD-10-CM | POA: Diagnosis not present

## 2014-09-03 DIAGNOSIS — R1084 Generalized abdominal pain: Secondary | ICD-10-CM | POA: Diagnosis not present

## 2014-09-19 DIAGNOSIS — H35341 Macular cyst, hole, or pseudohole, right eye: Secondary | ICD-10-CM | POA: Diagnosis not present

## 2014-09-21 DIAGNOSIS — D0511 Intraductal carcinoma in situ of right breast: Secondary | ICD-10-CM | POA: Insufficient documentation

## 2014-09-21 NOTE — Progress Notes (Signed)
[place]  [phone] [phone]  ID: Gail Mccarthy OB: [birth year]  MR#: [id number]  AOZ#:[id number]  Patient Care Team: Adrian Prows, MD as PCP - General (Infectious Diseases)  CHIEF COMPLAINT:  Chief Complaint  Patient presents with  . Follow-up    breast cancer    INTERVAL HISTORY: Patient returns to clinic today for routine six-month follow-up. She continues to tolerate tamoxifen. She has no neurologic complaints.  She denies any recent fevers or illnesses.  She has a good appetite and denies weight loss.  She denies any chest pain or shortness of breath.  She denies any nausea, vomiting, constipation, or diarrhea.  She has no urinary complaints.  Patient offers no specific complaints today.   REVIEW OF SYSTEMS:   Review of Systems  Constitutional: Negative.     As per HPI. Otherwise, a complete review of systems is negatve.  PAST MEDICAL HISTORY: Past Medical History  Diagnosis Date  . Radiation 2014  . Breast cancer 2014    lumpectomy  . Diabetes mellitus without complication   . Hypertension   . High cholesterol   . Renal cell carcinoma   . Renal insufficiency   . Diverticulosis     PAST SURGICAL HISTORY: Past Surgical History  Procedure Laterality Date  . Breast biopsy Right 2014  . Appendectomy    . Cholecystectomy    . Tonsillectomy      FAMILY HISTORY Family History  Problem Relation Age of Onset  . Breast cancer Maternal Aunt   . Diabetes Other   . COPD Other   . Colitis Other        ADVANCED DIRECTIVES:    HEALTH MAINTENANCE: History  Substance Use Topics  . Smoking status: Not on file  . Smokeless tobacco: Not on file  . Alcohol Use: Not on file     Colonoscopy:  PAP:  Bone density:  Lipid panel:  Not on File  Current Outpatient Prescriptions  Medication Sig Dispense Refill  . acetaminophen (TYLENOL) 500 MG tablet Take 1,000 mg by mouth every 6 (six) hours as needed.    Marland Kitchen  aspirin 81 MG tablet Take 81 mg by mouth daily.    . benazepril (LOTENSIN) 10 MG tablet Take 10 mg by mouth daily.    . Multiple Vitamins-Minerals (PRESERVISION AREDS) TABS Take 60 mg by mouth daily.    . pantoprazole (PROTONIX) 40 MG tablet Take 40 mg by mouth daily.    . tamoxifen (NOLVADEX) 20 MG tablet Take 20 mg by mouth daily.    Marland Kitchen triamterene-hydrochlorothiazide (MAXZIDE-25) 37.5-25 MG per tablet Take 1 tablet by mouth daily.     No current facility-administered medications for this visit.    OBJECTIVE: Filed Vitals:   08/28/14 1628  BP: 118/64  Pulse: 65  Temp: 97.3 F (36.3 C)  Resp: 20     Body mass index is 37.39 kg/(m^2).    ECOG FS:0 - Asymptomatic  General: Well-developed, well-nourished, no acute distress. Eyes: anicteric sclera. Breasts: Bilateral breast and axilla without lumps or masses. Lungs: Clear to auscultation bilaterally. Heart: Regular rate and rhythm. No rubs, murmurs, or gallops. Abdomen: Soft, nontender, nondistended. No organomegaly noted, normoactive bowel sounds. Musculoskeletal: No edema, cyanosis, or clubbing. Neuro: Alert, answering all questions appropriately. Cranial nerves grossly intact. Skin: No rashes or petechiae noted. Psych: Normal affect.  LAB RESULTS:  Lab Results  Component Value Date   K 4.1 01/10/2014    Lab Results  Component Value Date   WBC  6.5 11/08/2012   NEUTROABS 4.5 11/08/2012   HGB 14.6 11/08/2012   HCT 43.7 11/08/2012   MCV 80 11/08/2012   PLT 195 11/08/2012     STUDIES: No results found.  ASSESSMENT: DCIS, right breast.  PLAN:    1.  DCIS: No evidence of disease.  Continue tamoxifen, completing in August 2019. She had a right breast mammogram on April 10, 2014 that was reported as BI-RADS 2. No intervention is needed at this time. Return to clinic in 6 months for routine evaluation.   Patient expressed understanding and was in agreement with this plan. She also understands that She can call clinic  at any time with any questions, concerns, or complaints.   DCIS (ductal carcinoma in situ)   Staging form: Breast, AJCC 7th Edition     Clinical stage from 09/21/2014: Stage 0 (Tis (DCIS), N0, M0) - Signed by Lloyd Huger, MD on 09/21/2014   Lloyd Huger, MD   09/21/2014 1:40 PM

## 2014-09-26 ENCOUNTER — Other Ambulatory Visit: Payer: Self-pay | Admitting: Oncology

## 2014-10-07 ENCOUNTER — Encounter: Payer: Self-pay | Admitting: *Deleted

## 2014-10-08 ENCOUNTER — Ambulatory Visit
Admission: RE | Admit: 2014-10-08 | Discharge: 2014-10-08 | Disposition: A | Discharge status: Home / Self Care | Payer: Medicare Other | Source: Ambulatory Visit | Attending: Unknown Physician Specialty | Admitting: Unknown Physician Specialty

## 2014-10-08 ENCOUNTER — Ambulatory Visit: Payer: Medicare Other | Admitting: Anesthesiology

## 2014-10-08 ENCOUNTER — Encounter
Admission: RE | Disposition: A | Discharge status: Home / Self Care | Payer: Self-pay | Source: Ambulatory Visit | Attending: Unknown Physician Specialty

## 2014-10-08 ENCOUNTER — Encounter: Payer: Self-pay | Admitting: Anesthesiology

## 2014-10-08 DIAGNOSIS — E780 Pure hypercholesterolemia: Secondary | ICD-10-CM | POA: Diagnosis not present

## 2014-10-08 DIAGNOSIS — K760 Fatty (change of) liver, not elsewhere classified: Secondary | ICD-10-CM | POA: Diagnosis not present

## 2014-10-08 DIAGNOSIS — Z9049 Acquired absence of other specified parts of digestive tract: Secondary | ICD-10-CM | POA: Diagnosis not present

## 2014-10-08 DIAGNOSIS — Z853 Personal history of malignant neoplasm of breast: Secondary | ICD-10-CM | POA: Diagnosis not present

## 2014-10-08 DIAGNOSIS — K5730 Diverticulosis of large intestine without perforation or abscess without bleeding: Secondary | ICD-10-CM | POA: Diagnosis not present

## 2014-10-08 DIAGNOSIS — E119 Type 2 diabetes mellitus without complications: Secondary | ICD-10-CM | POA: Insufficient documentation

## 2014-10-08 DIAGNOSIS — Z79899 Other long term (current) drug therapy: Secondary | ICD-10-CM | POA: Diagnosis not present

## 2014-10-08 DIAGNOSIS — D120 Benign neoplasm of cecum: Secondary | ICD-10-CM | POA: Insufficient documentation

## 2014-10-08 DIAGNOSIS — K648 Other hemorrhoids: Secondary | ICD-10-CM | POA: Diagnosis not present

## 2014-10-08 DIAGNOSIS — Z923 Personal history of irradiation: Secondary | ICD-10-CM | POA: Insufficient documentation

## 2014-10-08 DIAGNOSIS — K317 Polyp of stomach and duodenum: Secondary | ICD-10-CM | POA: Diagnosis not present

## 2014-10-08 DIAGNOSIS — Z833 Family history of diabetes mellitus: Secondary | ICD-10-CM | POA: Insufficient documentation

## 2014-10-08 DIAGNOSIS — K640 First degree hemorrhoids: Secondary | ICD-10-CM | POA: Insufficient documentation

## 2014-10-08 DIAGNOSIS — Z85528 Personal history of other malignant neoplasm of kidney: Secondary | ICD-10-CM | POA: Diagnosis not present

## 2014-10-08 DIAGNOSIS — Z803 Family history of malignant neoplasm of breast: Secondary | ICD-10-CM | POA: Diagnosis not present

## 2014-10-08 DIAGNOSIS — Z7982 Long term (current) use of aspirin: Secondary | ICD-10-CM | POA: Insufficient documentation

## 2014-10-08 DIAGNOSIS — K449 Diaphragmatic hernia without obstruction or gangrene: Secondary | ICD-10-CM | POA: Insufficient documentation

## 2014-10-08 DIAGNOSIS — Z86010 Personal history of colonic polyps: Secondary | ICD-10-CM | POA: Insufficient documentation

## 2014-10-08 DIAGNOSIS — D175 Benign lipomatous neoplasm of intra-abdominal organs: Secondary | ICD-10-CM | POA: Diagnosis not present

## 2014-10-08 DIAGNOSIS — I10 Essential (primary) hypertension: Secondary | ICD-10-CM | POA: Diagnosis not present

## 2014-10-08 DIAGNOSIS — N289 Disorder of kidney and ureter, unspecified: Secondary | ICD-10-CM | POA: Insufficient documentation

## 2014-10-08 DIAGNOSIS — I499 Cardiac arrhythmia, unspecified: Secondary | ICD-10-CM | POA: Diagnosis not present

## 2014-10-08 DIAGNOSIS — Z825 Family history of asthma and other chronic lower respiratory diseases: Secondary | ICD-10-CM | POA: Insufficient documentation

## 2014-10-08 DIAGNOSIS — K219 Gastro-esophageal reflux disease without esophagitis: Secondary | ICD-10-CM | POA: Diagnosis not present

## 2014-10-08 DIAGNOSIS — K2970 Gastritis, unspecified, without bleeding: Secondary | ICD-10-CM | POA: Diagnosis not present

## 2014-10-08 DIAGNOSIS — Z800 Family history of malignant neoplasm of digestive organs: Secondary | ICD-10-CM | POA: Insufficient documentation

## 2014-10-08 DIAGNOSIS — K5790 Diverticulosis of intestine, part unspecified, without perforation or abscess without bleeding: Secondary | ICD-10-CM | POA: Diagnosis not present

## 2014-10-08 DIAGNOSIS — D131 Benign neoplasm of stomach: Secondary | ICD-10-CM | POA: Diagnosis not present

## 2014-10-08 DIAGNOSIS — K2960 Other gastritis without bleeding: Secondary | ICD-10-CM | POA: Diagnosis not present

## 2014-10-08 DIAGNOSIS — K635 Polyp of colon: Secondary | ICD-10-CM | POA: Diagnosis not present

## 2014-10-08 DIAGNOSIS — K2950 Unspecified chronic gastritis without bleeding: Secondary | ICD-10-CM | POA: Diagnosis not present

## 2014-10-08 HISTORY — DX: Fatty (change of) liver, not elsewhere classified: K76.0

## 2014-10-08 HISTORY — PX: ESOPHAGOGASTRODUODENOSCOPY (EGD) WITH PROPOFOL: SHX5813

## 2014-10-08 HISTORY — DX: Cardiac arrhythmia, unspecified: I49.9

## 2014-10-08 HISTORY — DX: Acquired absence of right breast and nipple: Z90.11

## 2014-10-08 HISTORY — PX: COLONOSCOPY WITH PROPOFOL: SHX5780

## 2014-10-08 HISTORY — DX: Hepatomegaly, not elsewhere classified: R16.0

## 2014-10-08 HISTORY — DX: Neoplasm of unspecified behavior of unspecified kidney: D49.519

## 2014-10-08 SURGERY — COLONOSCOPY WITH PROPOFOL
Anesthesia: General

## 2014-10-08 MED ORDER — PROPOFOL 10 MG/ML IV BOLUS
INTRAVENOUS | Status: DC | PRN
  Administered 2014-10-08: 50 mg via INTRAVENOUS

## 2014-10-08 MED ORDER — FENTANYL CITRATE (PF) 100 MCG/2ML IJ SOLN
INTRAMUSCULAR | Status: DC | PRN
  Administered 2014-10-08: 50 ug via INTRAVENOUS

## 2014-10-08 MED ORDER — PROPOFOL INFUSION 10 MG/ML OPTIME
INTRAVENOUS | Status: DC | PRN
  Administered 2014-10-08: 140 ug/kg/min via INTRAVENOUS

## 2014-10-08 MED ORDER — SODIUM CHLORIDE 0.9 % IV SOLN
INTRAVENOUS | Status: DC
  Administered 2014-10-08: 1000 mL via INTRAVENOUS
  Administered 2014-10-08: 14:00:00 via INTRAVENOUS

## 2014-10-08 MED ORDER — SODIUM CHLORIDE 0.9 % IV SOLN: INTRAVENOUS | Status: DC

## 2014-10-08 MED ORDER — LIDOCAINE HCL (PF) 2 % IJ SOLN
INTRAMUSCULAR | Status: DC | PRN
  Administered 2014-10-08: 50 mg

## 2014-10-08 NOTE — Op Note (Signed)
[facility] Gastroenterology Patient Name: Gail Mccarthy Procedure Date: 10/08/2014 2:06 PM MRN: [id number] Account #: [id number] Date of Birth: 11-28-1940 Admit Type: Outpatient Age: 74 Room: [facility] ENDO ROOM 4 Gender: Female Note Status: Finalized Procedure:         Upper GI endoscopy Indications:       Heartburn Providers:         Manya Silvas, MD Referring MD:      Youlanda Roys. Ola Spurr, MD (Referring MD) Medicines:         Propofol per Anesthesia Complications:     No immediate complications. Procedure:         Pre-Anesthesia Assessment:                    - After reviewing the risks and benefits, the patient was                     deemed in satisfactory condition to undergo the procedure.                    After obtaining informed consent, the endoscope was passed                     under direct vision. Throughout the procedure, the                     patient's blood pressure, pulse, and oxygen saturations                     were monitored continuously. The Olympus GIF-160 endoscope                     (S#. [id number]) was introduced through the mouth, and                     advanced to the second part of duodenum. The upper GI                     endoscopy was accomplished without difficulty. The patient                     tolerated the procedure well. Findings:      A small hiatus hernia was present. Esophagus ottherwise normal.      A few 9 mm pedunculated and sessile polyps with no bleeding and no       stigmata of recent bleeding were found in the gastric body. Biopsies       were taken with a cold forceps for histology.      Diffuse mild inflammation characterized by erythema and granularity was       found in the gastric body. Biopsies were taken with a cold forceps from       body and antrum for histology.      The examined duodenum was normal. Impression:        - Small hiatus hernia.                    - A few gastric polyps. Biopsied.                   - Gastritis. Biopsied.                    - Normal examined duodenum. Recommendation:    - Await pathology results.                    -  Perform a colonoscopy as previously scheduled. Manya Silvas, MD 10/08/2014 2:23:50 PM This report has been signed electronically. Number of Addenda: 0 Note Initiated On: 10/08/2014 2:06 PM      [facility]

## 2014-10-08 NOTE — Transfer of Care (Signed)
Immediate Anesthesia Transfer of Care Note  Patient: Gail Mccarthy  Procedure(s) Performed: Procedure(s): COLONOSCOPY WITH PROPOFOL (N/A) ESOPHAGOGASTRODUODENOSCOPY (EGD) WITH PROPOFOL (N/A)  Patient Location: PACU  Anesthesia Type:General  Level of Consciousness: sedated  Airway & Oxygen Therapy: [place]   Post-op Assessment: Report given to RN and Post -op Vital signs reviewed and stable  Post vital signs: Reviewed and stable  Last Vitals:  Filed Vitals:   10/08/14 1315  BP: 144/92  Pulse: 64  Temp: 36.8 C  Resp: 20    Complications: No apparent anesthesia complications

## 2014-10-08 NOTE — Op Note (Signed)
[facility] Gastroenterology Patient Name: Gail Mccarthy Procedure Date: 10/08/2014 2:06 PM MRN: [id number] Account #: [id number] Date of Birth: [birth year] Admit Type: Outpatient Age: 74 Room: [facility] ENDO ROOM 4 Gender: Female Note Status: Finalized Procedure:         Colonoscopy Indications:       Personal history of colonic polyps Providers:         Manya Silvas, MD Referring MD:      Youlanda Roys. Ola Spurr, MD (Referring MD) Medicines:         Propofol per Anesthesia Complications:     No immediate complications. Procedure:         Pre-Anesthesia Assessment:                    - After reviewing the risks and benefits, the patient was                     deemed in satisfactory condition to undergo the procedure.                    After obtaining informed consent, the colonoscope was                     passed under direct vision. Throughout the procedure, the                     patient's blood pressure, pulse, and oxygen saturations                     were monitored continuously. The Colonoscope was                     introduced through the anus and advanced to the the cecum,                     identified by appendiceal orifice and ileocecal valve. The                     colonoscopy was performed without difficulty. The patient                     tolerated the procedure well. The quality of the bowel                     preparation was excellent. Findings:      A small polyp was found in the cecum. The polyp was sessile. The polyp       was removed with a hot snare. Resection and retrieval were complete.      A diminutive polyp was found in the cecum. The polyp was sessile. The       polyp was removed with a jumbo cold forceps. Resection and retrieval       were complete.      Multiple small-mouthed diverticula were found in the sigmoid colon and       in the descending colon.      Internal hemorrhoids were found during endoscopy. The hemorrhoids were        medium-sized and Grade I (internal hemorrhoids that do not prolapse).      There was a large lipoma, 20 mm in diameter, in the proximal ascending       colon.      The exam was otherwise without abnormality. Impression:        - One  small polyp in the cecum. Resected and retrieved.                    - One diminutive polyp in the cecum. Resected and                     retrieved.                    - Diverticulosis in the sigmoid colon and in the                     descending colon.                    - Internal hemorrhoids.                    - The examination was otherwise normal. Recommendation:    - Await pathology results. Manya Silvas, MD 10/08/2014 2:48:47 PM This report has been signed electronically. Number of Addenda: 0 Note Initiated On: 10/08/2014 2:06 PM Scope Withdrawal Time: 0 hours 16 minutes 14 seconds  Total Procedure Duration: 0 hours 19 minutes 25 seconds       [facility]

## 2014-10-08 NOTE — Anesthesia Postprocedure Evaluation (Signed)
  Anesthesia Post-op Note  Patient: Gail Mccarthy  Procedure(s) Performed: Procedure(s): COLONOSCOPY WITH PROPOFOL (N/A) ESOPHAGOGASTRODUODENOSCOPY (EGD) WITH PROPOFOL (N/A)  Anesthesia type:General  Patient location: PACU  Post pain: Pain level controlled  Post assessment: Post-op Vital signs reviewed, Patient's Cardiovascular Status Stable, Respiratory Function Stable, Patent Airway and No signs of Nausea or vomiting  Post vital signs: Reviewed and stable  Last Vitals:  Filed Vitals:   10/08/14 1520  BP: 158/76  Pulse: 50  Temp:   Resp: 24    Level of consciousness: awake, alert  and patient cooperative  Complications: No apparent anesthesia complications

## 2014-10-08 NOTE — H&P (Signed)
Primary Care Physician:  Adrian Prows, MD Primary Gastroenterologist:  Dr. Vira Agar  Pre-Procedure History & Physical: HPI:  Gail Mccarthy is a 74 y.o. female is here for an endoscopy and colonoscopy.   Past Medical History  Diagnosis Date  . Radiation 2014  . Breast cancer 2014    lumpectomy  . Diabetes mellitus without complication   . Hypertension   . High cholesterol   . Renal cell carcinoma   . Renal insufficiency   . Diverticulosis   . Liver mass   . Neoplasm of kidney   . Hepatic steatosis   . History of partial mastectomy of right breast   . Dysrhythmia     Past Surgical History  Procedure Laterality Date  . Breast biopsy Right 2014  . Appendectomy    . Cholecystectomy    . Tonsillectomy    . Eye surgery      macular hole repair    Prior to Admission medications   Medication Sig Start Date End Date Taking? Authorizing Provider  acetaminophen (TYLENOL) 500 MG tablet Take 1,000 mg by mouth every 6 (six) hours as needed.    Historical Provider, MD  aspirin 81 MG tablet Take 81 mg by mouth daily.    Historical Provider, MD  benazepril (LOTENSIN) 10 MG tablet Take 10 mg by mouth daily.    Historical Provider, MD  Multiple Vitamins-Minerals (PRESERVISION AREDS) TABS Take 60 mg by mouth daily.    Historical Provider, MD  pantoprazole (PROTONIX) 40 MG tablet Take 40 mg by mouth daily.    Historical Provider, MD  tamoxifen (NOLVADEX) 20 MG tablet Take 20 mg by mouth daily.    Historical Provider, MD  triamterene-hydrochlorothiazide (MAXZIDE-25) 37.5-25 MG per tablet Take 1 tablet by mouth daily.    Historical Provider, MD    Allergies as of 09/10/2014  . (Not on File)    Family History  Problem Relation Age of Onset  . Breast cancer Maternal Aunt   . Diabetes Other   . COPD Other   . Colitis Other     History   Social History  . Marital Status: Married    Spouse Name: N/A  . Number of Children: N/A  . Years of Education: N/A   Occupational  History  . Not on file.   Social History Main Topics  . Smoking status: Never Smoker   . Smokeless tobacco: Not on file  . Alcohol Use: Not on file  . Drug Use: Not on file  . Sexual Activity: Not on file   Other Topics Concern  . Not on file   Social History Narrative    Review of Systems: See HPI, otherwise negative ROS  Physical Exam: BP 144/92 mmHg  Pulse 64  Temp(Src) 98.2 F (36.8 C) (Tympanic)  Resp 20  Ht 5\' 5"  (1.651 m)  Wt 99.791 kg (220 lb)  BMI 36.61 kg/m2  SpO2 100% General:   Alert,  pleasant and cooperative in NAD Head:  Normocephalic and atraumatic. Neck:  Supple; no masses or thyromegaly. Lungs:  Clear throughout to auscultation.    Heart:  Regular rate and rhythm. Abdomen:  Soft, nontender and nondistended. Normal bowel sounds, without guarding, and without rebound.   Neurologic:  Alert and  oriented x4;  grossly normal neurologically.  Impression/Plan: Gail Mccarthy is here for an endoscopy and colonoscopy to be performed for [facility] colon polyps, GERD, gastric polyps  Risks, benefits, limitations, and alternatives regarding  endoscopy and colonoscopy have been reviewed  with the patient.  Questions have been answered.  All parties agreeable.   Gaylyn Cheers, MD  10/08/2014, 2:03 PM   Primary Care Physician:  Adrian Prows, MD Primary Gastroenterologist:  Dr. Vira Agar  Pre-Procedure History & Physical: HPI:  Gail Mccarthy is a 74 y.o. female is here for an endoscopy and colonoscopy.   Past Medical History  Diagnosis Date  . Radiation 2014  . Breast cancer 2014    lumpectomy  . Diabetes mellitus without complication   . Hypertension   . High cholesterol   . Renal cell carcinoma   . Renal insufficiency   . Diverticulosis   . Liver mass   . Neoplasm of kidney   . Hepatic steatosis   . History of partial mastectomy of right breast   . Dysrhythmia     Past Surgical History  Procedure Laterality Date  . Breast biopsy Right 2014   . Appendectomy    . Cholecystectomy    . Tonsillectomy    . Eye surgery      macular hole repair    Prior to Admission medications   Medication Sig Start Date End Date Taking? Authorizing Provider  acetaminophen (TYLENOL) 500 MG tablet Take 1,000 mg by mouth every 6 (six) hours as needed.    Historical Provider, MD  aspirin 81 MG tablet Take 81 mg by mouth daily.    Historical Provider, MD  benazepril (LOTENSIN) 10 MG tablet Take 10 mg by mouth daily.    Historical Provider, MD  Multiple Vitamins-Minerals (PRESERVISION AREDS) TABS Take 60 mg by mouth daily.    Historical Provider, MD  pantoprazole (PROTONIX) 40 MG tablet Take 40 mg by mouth daily.    Historical Provider, MD  tamoxifen (NOLVADEX) 20 MG tablet Take 20 mg by mouth daily.    Historical Provider, MD  triamterene-hydrochlorothiazide (MAXZIDE-25) 37.5-25 MG per tablet Take 1 tablet by mouth daily.    Historical Provider, MD    Allergies as of 09/10/2014  . (Not on File)    Family History  Problem Relation Age of Onset  . Breast cancer Maternal Aunt   . Diabetes Other   . COPD Other   . Colitis Other     History   Social History  . Marital Status: Married    Spouse Name: N/A  . Number of Children: N/A  . Years of Education: N/A   Occupational History  . Not on file.   Social History Main Topics  . Smoking status: Never Smoker   . Smokeless tobacco: Not on file  . Alcohol Use: Not on file  . Drug Use: Not on file  . Sexual Activity: Not on file   Other Topics Concern  . Not on file   Social History Narrative    Review of Systems: See HPI, otherwise negative ROS  Physical Exam: BP 144/92 mmHg  Pulse 64  Temp(Src) 98.2 F (36.8 C) (Tympanic)  Resp 20  Ht 5\' 5"  (1.651 m)  Wt 99.791 kg (220 lb)  BMI 36.61 kg/m2  SpO2 100% General:   Alert,  pleasant and cooperative in NAD Head:  Normocephalic and atraumatic. Neck:  Supple; no masses or thyromegaly. Lungs:  Clear throughout to auscultation.     Heart:  Regular rate and rhythm. Abdomen:  Soft, nontender and nondistended. Normal bowel sounds, without guarding, and without rebound.   Neurologic:  Alert and  oriented x4;  grossly normal neurologically.  Impression/Plan: Gail Mccarthy is here for an endoscopy and colonoscopy to  be performed for [facility] colon polyps and GERD and gastric polyps  Risks, benefits, limitations, and alternatives regarding  endoscopy and colonoscopy have been reviewed with the patient.  Questions have been answered.  All parties agreeable.   Gaylyn Cheers, MD  10/08/2014, 2:03 PM   Primary Care Physician:  Adrian Prows, MD Primary Gastroenterologist:  Dr. Vira Agar  Pre-Procedure History & Physical: HPI:  Gail Mccarthy is a 74 y.o. female is here for an endoscopy and colonoscopy.   Past Medical History  Diagnosis Date  . Radiation 2014  . Breast cancer 2014    lumpectomy  . Diabetes mellitus without complication   . Hypertension   . High cholesterol   . Renal cell carcinoma   . Renal insufficiency   . Diverticulosis   . Liver mass   . Neoplasm of kidney   . Hepatic steatosis   . History of partial mastectomy of right breast   . Dysrhythmia     Past Surgical History  Procedure Laterality Date  . Breast biopsy Right 2014  . Appendectomy    . Cholecystectomy    . Tonsillectomy    . Eye surgery      macular hole repair    Prior to Admission medications   Medication Sig Start Date End Date Taking? Authorizing Provider  acetaminophen (TYLENOL) 500 MG tablet Take 1,000 mg by mouth every 6 (six) hours as needed.    Historical Provider, MD  aspirin 81 MG tablet Take 81 mg by mouth daily.    Historical Provider, MD  benazepril (LOTENSIN) 10 MG tablet Take 10 mg by mouth daily.    Historical Provider, MD  Multiple Vitamins-Minerals (PRESERVISION AREDS) TABS Take 60 mg by mouth daily.    Historical Provider, MD  pantoprazole (PROTONIX) 40 MG tablet Take 40 mg by mouth daily.     Historical Provider, MD  tamoxifen (NOLVADEX) 20 MG tablet Take 20 mg by mouth daily.    Historical Provider, MD  triamterene-hydrochlorothiazide (MAXZIDE-25) 37.5-25 MG per tablet Take 1 tablet by mouth daily.    Historical Provider, MD    Allergies as of 09/10/2014  . (Not on File)    Family History  Problem Relation Age of Onset  . Breast cancer Maternal Aunt   . Diabetes Other   . COPD Other   . Colitis Other     History   Social History  . Marital Status: Married    Spouse Name: N/A  . Number of Children: N/A  . Years of Education: N/A   Occupational History  . Not on file.   Social History Main Topics  . Smoking status: Never Smoker   . Smokeless tobacco: Not on file  . Alcohol Use: Not on file  . Drug Use: Not on file  . Sexual Activity: Not on file   Other Topics Concern  . Not on file   Social History Narrative    Review of Systems: See HPI, otherwise negative ROS  Physical Exam: BP 144/92 mmHg  Pulse 64  Temp(Src) 98.2 F (36.8 C) (Tympanic)  Resp 20  Ht 5\' 5"  (1.651 m)  Wt 99.791 kg (220 lb)  BMI 36.61 kg/m2  SpO2 100% General:   Alert,  pleasant and cooperative in NAD Head:  Normocephalic and atraumatic. Neck:  Supple; no masses or thyromegaly. Lungs:  Clear throughout to auscultation.    Heart:  Regular rate and rhythm. Abdomen:  Soft, nontender and nondistended. Normal bowel sounds, without guarding, and without rebound.  Neurologic:  Alert and  oriented x4;  grossly normal neurologically.  Impression/Plan: Gail Mccarthy is here for an endoscopy and colonoscopy to be performed for [facility] colon polyps and GERD and gastric polyps  Risks, benefits, limitations, and alternatives regarding  endoscopy and colonoscopy have been reviewed with the patient.  Questions have been answered.  All parties agreeable.   Gaylyn Cheers, MD  10/08/2014, 2:03 PM   Primary Care Physician:  Adrian Prows, MD Primary Gastroenterologist:  Dr.  Vira Agar  Pre-Procedure History & Physical: HPI:  Gail Mccarthy is a 74 y.o. female is here for an endoscopy and colonoscopy.   Past Medical History  Diagnosis Date  . Radiation 2014  . Breast cancer 2014    lumpectomy  . Diabetes mellitus without complication   . Hypertension   . High cholesterol   . Renal cell carcinoma   . Renal insufficiency   . Diverticulosis   . Liver mass   . Neoplasm of kidney   . Hepatic steatosis   . History of partial mastectomy of right breast   . Dysrhythmia     Past Surgical History  Procedure Laterality Date  . Breast biopsy Right 2014  . Appendectomy    . Cholecystectomy    . Tonsillectomy    . Eye surgery      macular hole repair    Prior to Admission medications   Medication Sig Start Date End Date Taking? Authorizing Provider  acetaminophen (TYLENOL) 500 MG tablet Take 1,000 mg by mouth every 6 (six) hours as needed.    Historical Provider, MD  aspirin 81 MG tablet Take 81 mg by mouth daily.    Historical Provider, MD  benazepril (LOTENSIN) 10 MG tablet Take 10 mg by mouth daily.    Historical Provider, MD  Multiple Vitamins-Minerals (PRESERVISION AREDS) TABS Take 60 mg by mouth daily.    Historical Provider, MD  pantoprazole (PROTONIX) 40 MG tablet Take 40 mg by mouth daily.    Historical Provider, MD  tamoxifen (NOLVADEX) 20 MG tablet Take 20 mg by mouth daily.    Historical Provider, MD  triamterene-hydrochlorothiazide (MAXZIDE-25) 37.5-25 MG per tablet Take 1 tablet by mouth daily.    Historical Provider, MD    Allergies as of 09/10/2014  . (Not on File)    Family History  Problem Relation Age of Onset  . Breast cancer Maternal Aunt   . Diabetes Other   . COPD Other   . Colitis Other     History   Social History  . Marital Status: Married    Spouse Name: N/A  . Number of Children: N/A  . Years of Education: N/A   Occupational History  . Not on file.   Social History Main Topics  . Smoking status: Never  Smoker   . Smokeless tobacco: Not on file  . Alcohol Use: Not on file  . Drug Use: Not on file  . Sexual Activity: Not on file   Other Topics Concern  . Not on file   Social History Narrative    Review of Systems: See HPI, otherwise negative ROS  Physical Exam: BP 144/92 mmHg  Pulse 64  Temp(Src) 98.2 F (36.8 C) (Tympanic)  Resp 20  Ht 5\' 5"  (1.651 m)  Wt 99.791 kg (220 lb)  BMI 36.61 kg/m2  SpO2 100% General:   Alert,  pleasant and cooperative in NAD Head:  Normocephalic and atraumatic. Neck:  Supple; no masses or thyromegaly. Lungs:  Clear throughout to auscultation.  Heart:  Regular rate and rhythm. Abdomen:  Soft, nontender and nondistended. Normal bowel sounds, without guarding, and without rebound.   Neurologic:  Alert and  oriented x4;  grossly normal neurologically.  Impression/Plan: Gail Mccarthy is here for an endoscopy and colonoscopy to be performed for [facility] colon polyps and gastric polyps and GERD  Risks, benefits, limitations, and alternatives regarding  endoscopy and colonoscopy have been reviewed with the patient.  Questions have been answered.  All parties agreeable.   Gaylyn Cheers, MD  10/08/2014, 2:03 PM

## 2014-10-08 NOTE — Anesthesia Preprocedure Evaluation (Addendum)
Anesthesia Evaluation  Patient identified by MRN, date of birth, ID band Patient awake    Reviewed: Allergy & Precautions, NPO status , Patient's Chart, lab work & pertinent test results, reviewed documented beta blocker date and time   Airway Mallampati: III  TM Distance: >3 FB     Dental  (+) Chipped   Pulmonary          Cardiovascular hypertension, Pt. on medications + dysrhythmias     Neuro/Psych    GI/Hepatic   Endo/Other  diabetes, Type 2  Renal/GU Renal InsufficiencyRenal disease     Musculoskeletal   Abdominal   Peds  Hematology   Anesthesia Other Findings   Reproductive/Obstetrics                            Anesthesia Physical Anesthesia Plan  ASA: III  Anesthesia Plan: General   Post-op Pain Management:    Induction: Intravenous  Airway Management Planned: Nasal Cannula  Additional Equipment:   Intra-op Plan:   Post-operative Plan:   Informed Consent: I have reviewed the patients History and Physical, chart, labs and discussed the procedure including the risks, benefits and alternatives for the proposed anesthesia with the patient or authorized representative who has indicated his/her understanding and acceptance.     Plan Discussed with: CRNA  Anesthesia Plan Comments:         Anesthesia Quick Evaluation

## 2014-10-12 LAB — SURGICAL PATHOLOGY

## 2014-10-13 ENCOUNTER — Encounter: Payer: Self-pay | Admitting: Unknown Physician Specialty

## 2014-10-22 DIAGNOSIS — H2513 Age-related nuclear cataract, bilateral: Secondary | ICD-10-CM | POA: Diagnosis not present

## 2014-10-22 DIAGNOSIS — H2511 Age-related nuclear cataract, right eye: Secondary | ICD-10-CM | POA: Diagnosis not present

## 2014-10-23 ENCOUNTER — Encounter: Payer: Self-pay | Admitting: *Deleted

## 2014-10-23 DIAGNOSIS — E119 Type 2 diabetes mellitus without complications: Secondary | ICD-10-CM | POA: Diagnosis not present

## 2014-10-23 DIAGNOSIS — H2511 Age-related nuclear cataract, right eye: Secondary | ICD-10-CM | POA: Diagnosis not present

## 2014-10-23 DIAGNOSIS — Z7982 Long term (current) use of aspirin: Secondary | ICD-10-CM | POA: Diagnosis not present

## 2014-10-23 DIAGNOSIS — E780 Pure hypercholesterolemia: Secondary | ICD-10-CM | POA: Diagnosis not present

## 2014-10-23 DIAGNOSIS — Z905 Acquired absence of kidney: Secondary | ICD-10-CM | POA: Diagnosis not present

## 2014-10-23 DIAGNOSIS — Z91040 Latex allergy status: Secondary | ICD-10-CM | POA: Diagnosis not present

## 2014-10-23 DIAGNOSIS — K5790 Diverticulosis of intestine, part unspecified, without perforation or abscess without bleeding: Secondary | ICD-10-CM | POA: Diagnosis not present

## 2014-10-23 DIAGNOSIS — Z853 Personal history of malignant neoplasm of breast: Secondary | ICD-10-CM | POA: Diagnosis not present

## 2014-10-23 DIAGNOSIS — N816 Rectocele: Secondary | ICD-10-CM | POA: Diagnosis not present

## 2014-10-23 DIAGNOSIS — R05 Cough: Secondary | ICD-10-CM | POA: Diagnosis not present

## 2014-10-23 DIAGNOSIS — R002 Palpitations: Secondary | ICD-10-CM | POA: Diagnosis not present

## 2014-10-23 DIAGNOSIS — Z79899 Other long term (current) drug therapy: Secondary | ICD-10-CM | POA: Diagnosis not present

## 2014-10-23 DIAGNOSIS — K219 Gastro-esophageal reflux disease without esophagitis: Secondary | ICD-10-CM | POA: Diagnosis not present

## 2014-10-23 DIAGNOSIS — K2970 Gastritis, unspecified, without bleeding: Secondary | ICD-10-CM | POA: Diagnosis not present

## 2014-10-23 DIAGNOSIS — Z85528 Personal history of other malignant neoplasm of kidney: Secondary | ICD-10-CM | POA: Diagnosis not present

## 2014-10-23 DIAGNOSIS — K589 Irritable bowel syndrome without diarrhea: Secondary | ICD-10-CM | POA: Diagnosis not present

## 2014-10-23 DIAGNOSIS — Z885 Allergy status to narcotic agent status: Secondary | ICD-10-CM | POA: Diagnosis not present

## 2014-10-23 DIAGNOSIS — K449 Diaphragmatic hernia without obstruction or gangrene: Secondary | ICD-10-CM | POA: Diagnosis not present

## 2014-10-23 DIAGNOSIS — Z9049 Acquired absence of other specified parts of digestive tract: Secondary | ICD-10-CM | POA: Diagnosis not present

## 2014-10-23 DIAGNOSIS — I10 Essential (primary) hypertension: Secondary | ICD-10-CM | POA: Diagnosis not present

## 2014-10-24 DIAGNOSIS — E119 Type 2 diabetes mellitus without complications: Secondary | ICD-10-CM | POA: Diagnosis not present

## 2014-10-24 DIAGNOSIS — K760 Fatty (change of) liver, not elsewhere classified: Secondary | ICD-10-CM | POA: Diagnosis not present

## 2014-10-24 DIAGNOSIS — E785 Hyperlipidemia, unspecified: Secondary | ICD-10-CM | POA: Diagnosis not present

## 2014-10-24 DIAGNOSIS — R7989 Other specified abnormal findings of blood chemistry: Secondary | ICD-10-CM | POA: Diagnosis not present

## 2014-10-27 ENCOUNTER — Ambulatory Visit: Payer: Medicare Other | Admitting: Anesthesiology

## 2014-10-27 ENCOUNTER — Ambulatory Visit
Admission: RE | Admit: 2014-10-27 | Discharge: 2014-10-27 | Disposition: A | Discharge status: Home / Self Care | Payer: Medicare Other | Source: Ambulatory Visit | Attending: Ophthalmology | Admitting: Ophthalmology

## 2014-10-27 ENCOUNTER — Encounter: Payer: Self-pay | Admitting: Anesthesiology

## 2014-10-27 ENCOUNTER — Encounter
Admission: RE | Disposition: A | Discharge status: Home / Self Care | Payer: Self-pay | Source: Ambulatory Visit | Attending: Ophthalmology

## 2014-10-27 DIAGNOSIS — H2513 Age-related nuclear cataract, bilateral: Secondary | ICD-10-CM | POA: Diagnosis not present

## 2014-10-27 DIAGNOSIS — K589 Irritable bowel syndrome without diarrhea: Secondary | ICD-10-CM | POA: Diagnosis not present

## 2014-10-27 DIAGNOSIS — Z905 Acquired absence of kidney: Secondary | ICD-10-CM | POA: Insufficient documentation

## 2014-10-27 DIAGNOSIS — Z7982 Long term (current) use of aspirin: Secondary | ICD-10-CM | POA: Insufficient documentation

## 2014-10-27 DIAGNOSIS — Z85528 Personal history of other malignant neoplasm of kidney: Secondary | ICD-10-CM | POA: Insufficient documentation

## 2014-10-27 DIAGNOSIS — E119 Type 2 diabetes mellitus without complications: Secondary | ICD-10-CM | POA: Diagnosis not present

## 2014-10-27 DIAGNOSIS — K449 Diaphragmatic hernia without obstruction or gangrene: Secondary | ICD-10-CM | POA: Diagnosis not present

## 2014-10-27 DIAGNOSIS — Z79899 Other long term (current) drug therapy: Secondary | ICD-10-CM | POA: Insufficient documentation

## 2014-10-27 DIAGNOSIS — H2511 Age-related nuclear cataract, right eye: Secondary | ICD-10-CM | POA: Insufficient documentation

## 2014-10-27 DIAGNOSIS — Z885 Allergy status to narcotic agent status: Secondary | ICD-10-CM | POA: Insufficient documentation

## 2014-10-27 DIAGNOSIS — E780 Pure hypercholesterolemia: Secondary | ICD-10-CM | POA: Insufficient documentation

## 2014-10-27 DIAGNOSIS — I10 Essential (primary) hypertension: Secondary | ICD-10-CM | POA: Diagnosis not present

## 2014-10-27 DIAGNOSIS — K5790 Diverticulosis of intestine, part unspecified, without perforation or abscess without bleeding: Secondary | ICD-10-CM | POA: Diagnosis not present

## 2014-10-27 DIAGNOSIS — K2970 Gastritis, unspecified, without bleeding: Secondary | ICD-10-CM | POA: Insufficient documentation

## 2014-10-27 DIAGNOSIS — N816 Rectocele: Secondary | ICD-10-CM | POA: Insufficient documentation

## 2014-10-27 DIAGNOSIS — K219 Gastro-esophageal reflux disease without esophagitis: Secondary | ICD-10-CM | POA: Diagnosis not present

## 2014-10-27 DIAGNOSIS — R05 Cough: Secondary | ICD-10-CM | POA: Insufficient documentation

## 2014-10-27 DIAGNOSIS — R002 Palpitations: Secondary | ICD-10-CM | POA: Insufficient documentation

## 2014-10-27 DIAGNOSIS — Z853 Personal history of malignant neoplasm of breast: Secondary | ICD-10-CM | POA: Insufficient documentation

## 2014-10-27 DIAGNOSIS — Z91040 Latex allergy status: Secondary | ICD-10-CM | POA: Insufficient documentation

## 2014-10-27 DIAGNOSIS — Z9049 Acquired absence of other specified parts of digestive tract: Secondary | ICD-10-CM | POA: Insufficient documentation

## 2014-10-27 HISTORY — PX: CATARACT EXTRACTION W/PHACO: SHX586

## 2014-10-27 HISTORY — DX: Personal history of other diseases of the digestive system: Z87.19

## 2014-10-27 HISTORY — DX: Gastro-esophageal reflux disease without esophagitis: K21.9

## 2014-10-27 HISTORY — DX: Palpitations: R00.2

## 2014-10-27 HISTORY — DX: Dizziness and giddiness: R42

## 2014-10-27 HISTORY — DX: Irritable bowel syndrome, unspecified: K58.9

## 2014-10-27 HISTORY — DX: Cough: R05

## 2014-10-27 HISTORY — DX: Cough, unspecified: R05.9

## 2014-10-27 LAB — POTASSIUM: POTASSIUM, SERUM: 4.1

## 2014-10-27 LAB — GLUCOSE, CAPILLARY: GLUCOSE-CAPILLARY: 135 mg/dL — AB (ref 65–99)

## 2014-10-27 SURGERY — PHACOEMULSIFICATION, CATARACT, WITH IOL INSERTION
Anesthesia: Monitor Anesthesia Care | Site: Eye | Laterality: Right | Wound class: Clean

## 2014-10-27 MED ORDER — EPINEPHRINE HCL 1 MG/ML IJ SOLN
INTRAOCULAR | Status: DC | PRN
  Administered 2014-10-27: 200 mL via OPHTHALMIC

## 2014-10-27 MED ORDER — MOXIFLOXACIN HCL 0.5 % OP SOLN
1.0000 [drp] | OPHTHALMIC | Status: AC | PRN
  Administered 2014-10-27 (×3): 1 [drp] via OPHTHALMIC

## 2014-10-27 MED ORDER — MOXIFLOXACIN HCL 0.5 % OP SOLN
OPHTHALMIC | Status: AC
  Administered 2014-10-27: 1 [drp] via OPHTHALMIC
  Filled 2014-10-27: qty 3

## 2014-10-27 MED ORDER — MOXIFLOXACIN HCL 0.5 % OP SOLN
OPHTHALMIC | Status: DC | PRN
  Administered 2014-10-27: 1 [drp] via OPHTHALMIC

## 2014-10-27 MED ORDER — ACETYLCHOLINE CHLORIDE 20 MG IO SOLR
INTRAOCULAR | Status: DC | PRN
  Administered 2014-10-27: 10 mg via INTRAOCULAR

## 2014-10-27 MED ORDER — ALFENTANIL 500 MCG/ML IJ INJ
INJECTION | INTRAMUSCULAR | Status: DC | PRN
  Administered 2014-10-27: 500 ug via INTRAVENOUS

## 2014-10-27 MED ORDER — CYCLOPENTOLATE HCL 2 % OP SOLN
1.0000 [drp] | OPHTHALMIC | Status: AC | PRN
  Administered 2014-10-27 (×4): 1 [drp] via OPHTHALMIC

## 2014-10-27 MED ORDER — CEFUROXIME OPHTHALMIC INJECTION 1 MG/0.1 ML
INJECTION | OPHTHALMIC | Status: AC
  Filled 2014-10-27: qty 0.1

## 2014-10-27 MED ORDER — LIDOCAINE HCL (PF) 4 % IJ SOLN
INTRAMUSCULAR | Status: DC | PRN
  Administered 2014-10-27: 8 mL via OPHTHALMIC

## 2014-10-27 MED ORDER — EPINEPHRINE HCL 1 MG/ML IJ SOLN
INTRAMUSCULAR | Status: AC
  Filled 2014-10-27: qty 1

## 2014-10-27 MED ORDER — CEFUROXIME OPHTHALMIC INJECTION 1 MG/0.1 ML
INJECTION | OPHTHALMIC | Status: DC | PRN
  Administered 2014-10-27: 0.1 mL via INTRACAMERAL

## 2014-10-27 MED ORDER — TETRACAINE HCL 0.5 % OP SOLN
OPHTHALMIC | Status: AC
  Filled 2014-10-27: qty 2

## 2014-10-27 MED ORDER — NA CHONDROIT SULF-NA HYALURON 40-17 MG/ML IO SOLN
INTRAOCULAR | Status: AC
  Filled 2014-10-27: qty 1

## 2014-10-27 MED ORDER — BUPIVACAINE HCL (PF) 0.75 % IJ SOLN
INTRAMUSCULAR | Status: AC
  Filled 2014-10-27: qty 10

## 2014-10-27 MED ORDER — NA CHONDROIT SULF-NA HYALURON 40-17 MG/ML IO SOLN
INTRAOCULAR | Status: DC | PRN
  Administered 2014-10-27: 1 mL via INTRAOCULAR

## 2014-10-27 MED ORDER — TETRACAINE HCL 0.5 % OP SOLN
OPHTHALMIC | Status: DC | PRN
  Administered 2014-10-27: 2 [drp] via OPHTHALMIC

## 2014-10-27 MED ORDER — HYALURONIDASE HUMAN 150 UNIT/ML IJ SOLN
INTRAMUSCULAR | Status: AC
  Filled 2014-10-27: qty 1

## 2014-10-27 MED ORDER — PHENYLEPHRINE HCL 10 % OP SOLN
1.0000 [drp] | OPHTHALMIC | Status: AC | PRN
  Administered 2014-10-27 (×4): 1 [drp] via OPHTHALMIC

## 2014-10-27 MED ORDER — LIDOCAINE HCL (PF) 4 % IJ SOLN
INTRAMUSCULAR | Status: AC
  Filled 2014-10-27: qty 5

## 2014-10-27 MED ORDER — ACETYLCHOLINE CHLORIDE 20 MG IO SOLR
INTRAOCULAR | Status: AC
  Filled 2014-10-27: qty 1

## 2014-10-27 MED ORDER — PHENYLEPHRINE HCL 10 % OP SOLN
OPHTHALMIC | Status: AC
  Administered 2014-10-27: 1 [drp] via OPHTHALMIC
  Filled 2014-10-27: qty 5

## 2014-10-27 MED ORDER — MIDAZOLAM HCL 2 MG/2ML IJ SOLN
INTRAMUSCULAR | Status: DC | PRN
  Administered 2014-10-27 (×2): 0.5 mg via INTRAVENOUS
  Administered 2014-10-27 (×2): 1 mg via INTRAVENOUS

## 2014-10-27 MED ORDER — SODIUM CHLORIDE 0.9 % IV SOLN
INTRAVENOUS | Status: DC
  Administered 2014-10-27: 07:00:00 via INTRAVENOUS

## 2014-10-27 MED ORDER — LIDOCAINE HCL (PF) 1 % IJ SOLN
INTRAOCULAR | Status: DC | PRN
  Administered 2014-10-27: 4 mL via OPHTHALMIC

## 2014-10-27 MED ORDER — CYCLOPENTOLATE HCL 2 % OP SOLN
OPHTHALMIC | Status: AC
  Administered 2014-10-27: 1 [drp] via OPHTHALMIC
  Filled 2014-10-27: qty 2

## 2014-10-27 SURGICAL SUPPLY — 30 items
CANNULA ANT/CHMB 27GA (MISCELLANEOUS) ×3 IMPLANT
CORD BIP STRL DISP 12FT (MISCELLANEOUS) ×3 IMPLANT
CUP MEDICINE 2OZ PLAST GRAD ST (MISCELLANEOUS) ×6 IMPLANT
DRAPE XRAY CASSETTE 23X24 (DRAPES) ×3 IMPLANT
ERASER HMR WETFIELD 18G (MISCELLANEOUS) ×3 IMPLANT
GLOVE BIO SURGEON STRL SZ8 (GLOVE) ×3 IMPLANT
GLOVE SURG LX 6.5 MICRO (GLOVE) ×2
GLOVE SURG LX 8.0 MICRO (GLOVE) ×2
GLOVE SURG LX STRL 6.5 MICRO (GLOVE) ×1 IMPLANT
GLOVE SURG LX STRL 8.0 MICRO (GLOVE) ×1 IMPLANT
GOWN STRL REUS W/ TWL LRG LVL3 (GOWN DISPOSABLE) ×1 IMPLANT
GOWN STRL REUS W/ TWL XL LVL3 (GOWN DISPOSABLE) ×1 IMPLANT
GOWN STRL REUS W/TWL LRG LVL3 (GOWN DISPOSABLE) ×2
GOWN STRL REUS W/TWL XL LVL3 (GOWN DISPOSABLE) ×2
LENS IOL ACRYSOF IQ 19.5 (Intraocular Lens) ×3 IMPLANT
PACK CATARACT (MISCELLANEOUS) ×3 IMPLANT
PACK CATARACT DINGLEDEIN LX (MISCELLANEOUS) ×3 IMPLANT
PACK EYE AFTER SURG (MISCELLANEOUS) ×3 IMPLANT
SHLD EYE VISITEC  UNIV (MISCELLANEOUS) ×3 IMPLANT
SOL BSS BAG (MISCELLANEOUS) ×3
SOL PREP PVP 2OZ (MISCELLANEOUS) ×3
SOLUTION BSS BAG (MISCELLANEOUS) ×1 IMPLANT
SOLUTION PREP PVP 2OZ (MISCELLANEOUS) ×1 IMPLANT
SUT SILK 5-0 (SUTURE) ×3 IMPLANT
SYR 3ML LL SCALE MARK (SYRINGE) ×3 IMPLANT
SYR 5ML LL (SYRINGE) ×3 IMPLANT
SYR TB 1ML 27GX1/2 LL (SYRINGE) ×3 IMPLANT
VICRYL 5.0 ×3 IMPLANT
WATER STERILE IRR 1000ML POUR (IV SOLUTION) ×3 IMPLANT
WIPE NON LINTING 3.25X3.25 (MISCELLANEOUS) ×3 IMPLANT

## 2014-10-27 NOTE — Anesthesia Postprocedure Evaluation (Signed)
  Anesthesia Post-op Note  Patient: Gail Mccarthy  Procedure(s) Performed: Procedure(s) with comments: CATARACT EXTRACTION PHACO AND INTRAOCULAR LENS PLACEMENT (IOC) (Right) - [place]: 01:30.0 AP%: 25.7 CDE: 40.11 Fluid lot# [id number]  Anesthesia type:MAC  Patient location: PACU  Post pain: Pain level controlled  Post assessment: Post-op Vital signs reviewed, Patient's Cardiovascular Status Stable, Respiratory Function Stable, Patent Airway and No signs of Nausea or vomiting  Post vital signs: Reviewed and stable  Last Vitals:  Filed Vitals:   10/27/14 0813  BP: 136/68  Pulse: 56  Temp: 36.3 C  Resp: 16    Level of consciousness: awake, alert  and patient cooperative  Complications: No apparent anesthesia complications

## 2014-10-27 NOTE — Transfer of Care (Signed)
Immediate Anesthesia Transfer of Care Note  Patient: Gail Mccarthy  Procedure(s) Performed: Procedure(s) with comments: CATARACT EXTRACTION PHACO AND INTRAOCULAR LENS PLACEMENT (IOC) (Right) - [place]: 01:30.0 AP%: 25.7 CDE: 40.11 Fluid lot# [id number]  Patient Location: PACU  Anesthesia Type:General  Level of Consciousness: awake, alert  and oriented  Airway & Oxygen Therapy: Patient Spontanous Breathing  Post-op Assessment: Report given to RN and Post -op Vital signs reviewed and stable  Post vital signs: Reviewed and stable  Last Vitals:  Filed Vitals:   10/27/14 0813  BP: 136/68  Pulse: 56  Temp: 36.3 C  Resp: 16    Complications: No apparent anesthesia complications

## 2014-10-27 NOTE — Discharge Instructions (Addendum)
See handout.Cataract Surgery Care After Refer to this sheet in the next few weeks. These instructions provide you with information on caring for yourself after your procedure. Your caregiver may also give you more specific instructions. Your treatment has been planned according to current medical practices, but problems sometimes occur. Call your caregiver if you have any problems or questions after your procedure.  HOME CARE INSTRUCTIONS   Avoid strenuous activities as directed by your caregiver.  Ask your caregiver when you can resume driving.  Use eyedrops or other medicines to help healing and control pressure inside your eye as directed by your caregiver.  Only take over-the-counter or prescription medicines for pain, discomfort, or fever as directed by your caregiver.  Do not to touch or rub your eyes.  You may be instructed to use a protective shield during the first few days and nights after surgery. If not, wear sunglasses to protect your eyes. This is to protect the eye from pressure or from being accidentally bumped.  Keep the area around your eye clean and dry. Avoid swimming or allowing water to hit you directly in the face while showering. Keep soap and shampoo out of your eyes.  Do not bend or lift heavy objects. Bending increases pressure in the eye. You can walk, climb stairs, and do light household chores.  Do not put a contact lens into the eye that had surgery until your caregiver says it is okay to do so.  Ask your doctor when you can return to work. This will depend on the kind of work that you do. If you work in a dusty environment, you may be advised to wear protective eyewear for a period of time.  Ask your caregiver when it will be safe to engage in sexual activity.  Continue with your regular eye exams as directed by your caregiver. What to expect:  It is normal to feel itching and mild discomfort for a few days after cataract surgery. Some fluid discharge is  also common, and your eye may be sensitive to light and touch.  After 1 to 2 days, even moderate discomfort should disappear. In most cases, healing will take about 6 weeks.  If you received an intraocular lens (IOL), you may notice that colors are very bright or have a blue tinge. Also, if you have been in bright sunlight, everything may appear reddish for a few hours. If you see these color tinges, it is because your lens is clear and no longer cloudy. Within a few months after receiving an IOL, these extra colors should go away. When you have healed, you will probably need new glasses. SEEK MEDICAL CARE IF:   You have increased bruising around your eye.  You have discomfort not helped by medicine. SEEK IMMEDIATE MEDICAL CARE IF:   You have a fever.  You have a worsening or sudden vision loss.  You have redness, swelling, or increasing pain in the eye.  You have a thick discharge from the eye that had surgery. MAKE SURE YOU:  Understand these instructions.  Will watch your condition.  Will get help right away if you are not doing well or get worse. Document Released: 09/24/2004 Document Revised: 05/30/2011 Document Reviewed: 10/29/2010 [facility] Patient Information 2015 [place], [place]. This information is not intended to replace advice given to you by your health care provider. Make sure you discuss any questions you have with your health care provider.

## 2014-10-27 NOTE — Op Note (Signed)
Date of Surgery: 10/27/2014 Date of Dictation: 10/27/2014 8:09 AM Pre-operative Diagnosis:  Nuclear Sclerotic Cataract right Eye Post-operative Diagnosis: same Procedure performed: Extra-capsular Cataract Extraction (ECCE) with placement of a posterior chamber intraocular lens (IOL) right Eye IOL:  Implant Name Type Inv. Item Serial No. Manufacturer Lot No. LRB No. Used  LENS IOL ACRYSOF IQ 19.5 - [id number] Intraocular Lens LENS IOL ACRYSOF IQ 19.5 [id number] ALCON   Right 1   Anesthesia: 2% Lidocaine and 4% Marcaine in a 50/50 mixture with 10 unites/ml of Hylenex given as a peribulbar Anesthesiologist: Anesthesiologist: Andria Frames, MD CRNA: Vaughan Sine Complications: none Estimated Blood Loss: less than 1 ml  Description of procedure:  The patient was given anesthesia and sedation via intravenous access. The patient was then prepped and draped in the usual fashion. A 25-gauge needle was bent for initiating the capsulorhexis. A 5-0 silk suture was placed through the conjunctiva superior and inferiorly to serve as bridle sutures. Hemostasis was obtained at the superior limbus using an eraser cautery. A partial thickness groove was made at the anterior surgical limbus with a 64 Beaver blade and this was dissected anteriorly with an [employer]. The anterior chamber was entered at 10 o'clock with a 1.0 mm paracentesis knife and through the lamellar dissection with a 2.6 mm Alcon keratome. Epi-Shugarcaine 0.5 CC [9 cc BSS Plus (Alcon), 3 cc 4% preservative-free lidocaine (Hospira) and 4 cc 1:1000 preservative-free, bisulfite-free epinephrine] was injected via the paracentesis tract. DiscoVisc was injected to replace the aqueous and a continuous tear curvilinear capsulorhexis was performed using a bent 25-gauge needle.  Balance salt on a syringe was used to perform hydro-dissection and phacoemulsification was carried out using a divide and conquer technique. Procedure(s) with  comments: CATARACT EXTRACTION PHACO AND INTRAOCULAR LENS PLACEMENT (IOC) (Right) - [place]: 01:30.0 AP%: 25.7 CDE: 40.11 Fluid lot# [id number]. Irrigation/aspiration was used to remove the residual cortex and the capsular bag was inflated with DiscoVisc. The intraocular lens was inserted into the capsular bag using a pre-loaded UltraSert Delivery System. Irrigation/aspiration was used to remove the residual DiscoVisc. The wound was inflated with balanced salt and checked for leaks. None were found. Miostat was injected via the paracentesis track and 0.1 ml of cefuroxime containing 1 mg of drug  was injected via the paracentesis track. The wound was checked for leaks again and none were found.   The bridal sutures were removed and two drops of Vigamox were placed on the eye. An eye shield was placed to protect the eye and the patient was discharged to the recovery area in good condition.   Gautham Hewins MD

## 2014-10-27 NOTE — Interval H&P Note (Signed)
History and Physical Interval Note:  10/27/2014 7:19 AM  Gail Mccarthy  has presented today for surgery, with the diagnosis of CATARACT  The various methods of treatment have been discussed with the patient and family. After consideration of risks, benefits and other options for treatment, the patient has consented to  Procedure(s): CATARACT EXTRACTION PHACO AND INTRAOCULAR LENS PLACEMENT ([place]) (Right) as a surgical intervention .  The patient's history has been reviewed, patient examined, no change in status, stable for surgery.  I have reviewed the patient's chart and labs.  Questions were answered to the patient's satisfaction.     Donis Kotowski

## 2014-10-27 NOTE — Anesthesia Preprocedure Evaluation (Signed)
Anesthesia Evaluation  Patient identified by MRN, date of birth, ID band Patient awake    Reviewed: Allergy & Precautions, H&P , NPO status , Patient's Chart, lab work & pertinent test results, reviewed documented beta blocker date and time   Airway Mallampati: III  TM Distance: >3 FB Neck ROM: limited    Dental  (+) Poor Dentition   Pulmonary neg pulmonary ROS,  breath sounds clear to auscultation  Pulmonary exam normal       Cardiovascular Exercise Tolerance: Good hypertension, Normal cardiovascular exam+ dysrhythmias Rhythm:regular Rate:Normal     Neuro/Psych negative neurological ROS  negative psych ROS   GI/Hepatic Neg liver ROS, hiatal hernia, GERD-  Controlled and Medicated,  Endo/Other  diabetes, Type 2  Renal/GU Renal disease  negative genitourinary   Musculoskeletal   Abdominal   Peds  Hematology negative hematology ROS (+)   Anesthesia Other Findings Past Medical History:   Radiation                                       2014         Diabetes mellitus without complication                       Hypertension                                                 High cholesterol                                             Renal insufficiency                                          Diverticulosis                                               Liver mass                                                   Neoplasm of kidney                                           Hepatic steatosis                                            History of partial mastectomy of right breast                Dysrhythmia  Dizziness                                                      Comment:MEDICINE RELATED   History of hiatal hernia                                     GERD (gastroesophageal reflux disease)                       IBS (irritable bowel syndrome)                              Breast cancer                                   2014           Comment:lumpectomy   Renal cell carcinoma                                         Cough                                                          Comment:CHRONIC   Heart palpitations                                           Reproductive/Obstetrics negative OB ROS                             Anesthesia Physical Anesthesia Plan  ASA: III  Anesthesia Plan: MAC   Post-op Pain Management:    Induction:   Airway Management Planned:   Additional Equipment:   Intra-op Plan:   Post-operative Plan:   Informed Consent: I have reviewed the patients History and Physical, chart, labs and discussed the procedure including the risks, benefits and alternatives for the proposed anesthesia with the patient or authorized representative who has indicated his/her understanding and acceptance.   Dental Advisory Given  Plan Discussed with: Anesthesiologist, CRNA and Surgeon  Anesthesia Plan Comments:         Anesthesia Quick Evaluation

## 2014-10-27 NOTE — H&P (Signed)
See scanned H&P

## 2014-10-27 NOTE — Anesthesia Procedure Notes (Signed)
Performed by: COOK-MARTIN, Zeta Bucy Pre-anesthesia Checklist: Patient identified, Emergency Drugs available, Suction available, Patient being monitored and Timeout performed Patient Re-evaluated:Patient Re-evaluated prior to inductionOxygen Delivery Method: Nasal cannula Preoxygenation: Pre-oxygenation with 100% oxygen Intubation Type: IV induction Placement Confirmation: positive ETCO2 and CO2 detector       

## 2014-11-04 DIAGNOSIS — E785 Hyperlipidemia, unspecified: Secondary | ICD-10-CM | POA: Diagnosis not present

## 2014-11-04 DIAGNOSIS — N183 Chronic kidney disease, stage 3 (moderate): Secondary | ICD-10-CM | POA: Diagnosis not present

## 2014-11-04 DIAGNOSIS — E119 Type 2 diabetes mellitus without complications: Secondary | ICD-10-CM | POA: Diagnosis not present

## 2014-11-04 DIAGNOSIS — I10 Essential (primary) hypertension: Secondary | ICD-10-CM | POA: Diagnosis not present

## 2014-12-17 DIAGNOSIS — N183 Chronic kidney disease, stage 3 (moderate): Secondary | ICD-10-CM | POA: Diagnosis not present

## 2014-12-17 DIAGNOSIS — I10 Essential (primary) hypertension: Secondary | ICD-10-CM | POA: Diagnosis not present

## 2014-12-17 DIAGNOSIS — E119 Type 2 diabetes mellitus without complications: Secondary | ICD-10-CM | POA: Diagnosis not present

## 2014-12-19 ENCOUNTER — Telehealth: Payer: Self-pay | Admitting: *Deleted

## 2014-12-19 MED ORDER — TAMOXIFEN CITRATE 20 MG PO TABS: 20.0000 mg | ORAL_TABLET | Freq: Every day | ORAL | Status: DC

## 2014-12-19 NOTE — Telephone Encounter (Signed)
Escribed

## 2014-12-24 DIAGNOSIS — N183 Chronic kidney disease, stage 3 (moderate): Secondary | ICD-10-CM | POA: Diagnosis not present

## 2015-01-06 DIAGNOSIS — Z124 Encounter for screening for malignant neoplasm of cervix: Secondary | ICD-10-CM | POA: Diagnosis not present

## 2015-01-06 DIAGNOSIS — Z853 Personal history of malignant neoplasm of breast: Secondary | ICD-10-CM | POA: Diagnosis not present

## 2015-01-15 DIAGNOSIS — Z23 Encounter for immunization: Secondary | ICD-10-CM | POA: Diagnosis not present

## 2015-01-19 DIAGNOSIS — E119 Type 2 diabetes mellitus without complications: Secondary | ICD-10-CM | POA: Diagnosis not present

## 2015-01-19 DIAGNOSIS — I10 Essential (primary) hypertension: Secondary | ICD-10-CM | POA: Diagnosis not present

## 2015-01-19 DIAGNOSIS — N183 Chronic kidney disease, stage 3 (moderate): Secondary | ICD-10-CM | POA: Diagnosis not present

## 2015-01-27 DIAGNOSIS — N183 Chronic kidney disease, stage 3 (moderate): Secondary | ICD-10-CM | POA: Diagnosis not present

## 2015-01-27 DIAGNOSIS — I10 Essential (primary) hypertension: Secondary | ICD-10-CM | POA: Diagnosis not present

## 2015-01-27 DIAGNOSIS — E119 Type 2 diabetes mellitus without complications: Secondary | ICD-10-CM | POA: Diagnosis not present

## 2015-03-05 ENCOUNTER — Inpatient Hospital Stay: Payer: Medicare Other | Attending: Oncology | Admitting: Oncology

## 2015-03-05 VITALS — BP 107/58 | HR 63 | Temp 97.8°F | Resp 18 | Wt 217.8 lb

## 2015-03-05 DIAGNOSIS — Z79899 Other long term (current) drug therapy: Secondary | ICD-10-CM | POA: Diagnosis not present

## 2015-03-05 DIAGNOSIS — Z85528 Personal history of other malignant neoplasm of kidney: Secondary | ICD-10-CM

## 2015-03-05 DIAGNOSIS — Z9011 Acquired absence of right breast and nipple: Secondary | ICD-10-CM | POA: Diagnosis not present

## 2015-03-05 DIAGNOSIS — Z79810 Long term (current) use of selective estrogen receptor modulators (SERMs): Secondary | ICD-10-CM

## 2015-03-05 DIAGNOSIS — I10 Essential (primary) hypertension: Secondary | ICD-10-CM | POA: Diagnosis not present

## 2015-03-05 DIAGNOSIS — Z7982 Long term (current) use of aspirin: Secondary | ICD-10-CM

## 2015-03-05 DIAGNOSIS — I499 Cardiac arrhythmia, unspecified: Secondary | ICD-10-CM

## 2015-03-05 DIAGNOSIS — E7800 Pure hypercholesterolemia, unspecified: Secondary | ICD-10-CM | POA: Diagnosis not present

## 2015-03-05 DIAGNOSIS — Z8719 Personal history of other diseases of the digestive system: Secondary | ICD-10-CM | POA: Diagnosis not present

## 2015-03-05 DIAGNOSIS — Z170 Estrogen receptor positive status [ER+]: Secondary | ICD-10-CM

## 2015-03-05 DIAGNOSIS — R160 Hepatomegaly, not elsewhere classified: Secondary | ICD-10-CM

## 2015-03-05 DIAGNOSIS — K760 Fatty (change of) liver, not elsewhere classified: Secondary | ICD-10-CM | POA: Diagnosis not present

## 2015-03-05 DIAGNOSIS — R05 Cough: Secondary | ICD-10-CM

## 2015-03-05 DIAGNOSIS — K449 Diaphragmatic hernia without obstruction or gangrene: Secondary | ICD-10-CM | POA: Diagnosis not present

## 2015-03-05 DIAGNOSIS — Z923 Personal history of irradiation: Secondary | ICD-10-CM | POA: Diagnosis not present

## 2015-03-05 DIAGNOSIS — D0511 Intraductal carcinoma in situ of right breast: Secondary | ICD-10-CM | POA: Diagnosis not present

## 2015-03-05 DIAGNOSIS — K219 Gastro-esophageal reflux disease without esophagitis: Secondary | ICD-10-CM | POA: Diagnosis not present

## 2015-03-05 DIAGNOSIS — N2889 Other specified disorders of kidney and ureter: Secondary | ICD-10-CM | POA: Diagnosis not present

## 2015-03-05 DIAGNOSIS — K589 Irritable bowel syndrome without diarrhea: Secondary | ICD-10-CM | POA: Diagnosis not present

## 2015-03-05 DIAGNOSIS — Z803 Family history of malignant neoplasm of breast: Secondary | ICD-10-CM | POA: Diagnosis not present

## 2015-03-05 DIAGNOSIS — E119 Type 2 diabetes mellitus without complications: Secondary | ICD-10-CM

## 2015-03-05 NOTE — Progress Notes (Signed)
Patient denies any problems today. 

## 2015-03-12 DIAGNOSIS — N183 Chronic kidney disease, stage 3 (moderate): Secondary | ICD-10-CM | POA: Diagnosis not present

## 2015-03-12 DIAGNOSIS — R21 Rash and other nonspecific skin eruption: Secondary | ICD-10-CM | POA: Diagnosis not present

## 2015-03-12 DIAGNOSIS — R768 Other specified abnormal immunological findings in serum: Secondary | ICD-10-CM | POA: Diagnosis not present

## 2015-03-20 DIAGNOSIS — L821 Other seborrheic keratosis: Secondary | ICD-10-CM | POA: Diagnosis not present

## 2015-03-20 DIAGNOSIS — L570 Actinic keratosis: Secondary | ICD-10-CM | POA: Diagnosis not present

## 2015-03-20 DIAGNOSIS — L920 Granuloma annulare: Secondary | ICD-10-CM | POA: Diagnosis not present

## 2015-03-20 DIAGNOSIS — D485 Neoplasm of uncertain behavior of skin: Secondary | ICD-10-CM | POA: Diagnosis not present

## 2015-03-21 NOTE — Progress Notes (Signed)
[place]  [phone] [phone]) [phone]  ID: Gail Mccarthy OB: [birth year]  MR#: [id number]  [id number]  Patient Care Team: Adrian Prows, MD as PCP - General (Infectious Diseases)  CHIEF COMPLAINT:  Chief Complaint  Patient presents with  . DCIS    INTERVAL HISTORY: Patient returns to clinic today for routine six-month follow-up. She continues to tolerate tamoxifen well without significant side effects. She has no neurologic complaints.  She denies any recent fevers or illnesses.  She has a good appetite and denies weight loss.  She denies any chest pain or shortness of breath.  She denies any nausea, vomiting, constipation, or diarrhea.  She has no urinary complaints.  Patient offers no specific complaints today.   REVIEW OF SYSTEMS:   Review of Systems  Constitutional: Negative.  Negative for malaise/fatigue.  Respiratory: Negative.   Cardiovascular: Negative.   Gastrointestinal: Negative.   Musculoskeletal: Negative.   Neurological: Negative.  Negative for weakness.    As per HPI. Otherwise, a complete review of systems is negatve.  PAST MEDICAL HISTORY: Past Medical History  Diagnosis Date  . Radiation 2014  . Diabetes mellitus without complication   . Hypertension   . High cholesterol   . Renal insufficiency   . Diverticulosis   . Liver mass   . Neoplasm of kidney   . Hepatic steatosis   . History of partial mastectomy of right breast   . Dysrhythmia   . Dizziness     MEDICINE RELATED  . History of hiatal hernia   . GERD (gastroesophageal reflux disease)   . IBS (irritable bowel syndrome)   . Breast cancer 2014    lumpectomy  . Renal cell carcinoma   . Cough     CHRONIC  . Heart palpitations     PAST SURGICAL HISTORY: Past Surgical History  Procedure Laterality Date  . Appendectomy    . Cholecystectomy    . Tonsillectomy    . Eye surgery      macular hole repair  . Colonoscopy with propofol N/A  10/08/2014    Procedure: COLONOSCOPY WITH PROPOFOL;  Surgeon: Manya Silvas, MD;  Location: [facility] ENDOSCOPY;  Service: Endoscopy;  Laterality: N/A;  . Esophagogastroduodenoscopy (egd) with propofol N/A 10/08/2014    Procedure: ESOPHAGOGASTRODUODENOSCOPY (EGD) WITH PROPOFOL;  Surgeon: Manya Silvas, MD;  Location: [facility] ENDOSCOPY;  Service: Endoscopy;  Laterality: N/A;  . Breast biopsy Right 2014    RIGHT PARTIAL MASTECTOMY 2014  . Nephrectomt Right   . Leg surgery Right     FX PINS,SCREWS  . Cataract extraction w/phaco Right 10/27/2014    Procedure: CATARACT EXTRACTION PHACO AND INTRAOCULAR LENS PLACEMENT (IOC);  Surgeon: Estill Cotta, MD;  Location: [facility] ORS;  Service: Ophthalmology;  Laterality: Right;  [place]: 01:30.0 AP%: 25.7 CDE: 40.11 Fluid lot# [id number]    FAMILY HISTORY Family History  Problem Relation Age of Onset  . Breast cancer Maternal Aunt   . Diabetes Other   . COPD Other   . Colitis Other        ADVANCED DIRECTIVES:    HEALTH MAINTENANCE: Social History  Substance Use Topics  . Smoking status: Never Smoker   . Smokeless tobacco: Not on file  . Alcohol Use: No     Colonoscopy:  PAP:  Bone density:  Lipid panel:  Allergies  Allergen Reactions  . Cefdinir Nausea And Vomiting  . Codeine Nausea Only    Current Outpatient Prescriptions  Medication Sig Dispense Refill  . acetaminophen (TYLENOL)  500 MG tablet Take 1,000 mg by mouth every 6 (six) hours as needed.    Marland Kitchen aspirin 81 MG tablet Take 81 mg by mouth daily.    . benazepril (LOTENSIN) 10 MG tablet Take 10 mg by mouth daily.    . Multiple Vitamins-Minerals (PRESERVISION AREDS) TABS Take 60 mg by mouth daily.    . pantoprazole (PROTONIX) 40 MG tablet Take 40 mg by mouth daily.    . sucralfate (CARAFATE) 1 G tablet Take 1 g by mouth 4 (four) times daily -  with meals and at bedtime.    . tamoxifen (NOLVADEX) 20 MG tablet Take 1 tablet (20 mg total) by mouth daily. 90 tablet 1  .  triamterene-hydrochlorothiazide (MAXZIDE-25) 37.5-25 MG per tablet Take 1 tablet by mouth daily.     No current facility-administered medications for this visit.    OBJECTIVE: Filed Vitals:   03/05/15 1048  BP: 107/58  Pulse: 63  Temp: 97.8 F (36.6 C)  Resp: 18     Body mass index is 36.25 kg/(m^2).    ECOG FS:0 - Asymptomatic  General: Well-developed, well-nourished, no acute distress. Eyes: anicteric sclera. Breasts: Bilateral breast and axilla without lumps or masses. Lungs: Clear to auscultation bilaterally. Heart: Regular rate and rhythm. No rubs, murmurs, or gallops. Abdomen: Soft, nontender, nondistended. No organomegaly noted, normoactive bowel sounds. Musculoskeletal: No edema, cyanosis, or clubbing. Neuro: Alert, answering all questions appropriately. Cranial nerves grossly intact. Skin: No rashes or petechiae noted. Psych: Normal affect.  LAB RESULTS:  Lab Results  Component Value Date   K 4.1 01/10/2014    Lab Results  Component Value Date   WBC 6.5 11/08/2012   NEUTROABS 4.5 11/08/2012   HGB 14.6 11/08/2012   HCT 43.7 11/08/2012   MCV 80 11/08/2012   PLT 195 11/08/2012     STUDIES: No results found.  ASSESSMENT: DCIS, right breast.  PLAN:    1.  DCIS: No evidence of disease.  Continue tamoxifen completing in August 2019. Patient most recent right breast mammogram on Jul 21, 2014 that was reported as BI-RADS 2, repeat in May 2017. No intervention is needed at this time. Return to clinic in 6 months for routine evaluation.  2. Hypertension: Blood pressure within normal limits today. Continue current medications as prescribed.   Patient expressed understanding and was in agreement with this plan. She also understands that She can call clinic at any time with any questions, concerns, or complaints.   DCIS (ductal carcinoma in situ)   Staging form: Breast, AJCC 7th Edition     Clinical stage from 09/21/2014: Stage 0 (Tis (DCIS), N0, M0) - Signed by  Lloyd Huger, MD on 09/21/2014   Lloyd Huger, MD   03/21/2015 12:49 PM

## 2015-04-01 DIAGNOSIS — J069 Acute upper respiratory infection, unspecified: Secondary | ICD-10-CM | POA: Diagnosis not present

## 2015-04-01 DIAGNOSIS — H109 Unspecified conjunctivitis: Secondary | ICD-10-CM | POA: Diagnosis not present

## 2015-04-17 ENCOUNTER — Encounter: Payer: Self-pay | Admitting: Radiation Oncology

## 2015-04-17 ENCOUNTER — Ambulatory Visit
Admission: RE | Admit: 2015-04-17 | Discharge: 2015-04-17 | Disposition: A | Discharge status: Home / Self Care | Payer: Medicare Other | Source: Ambulatory Visit | Attending: Radiation Oncology | Admitting: Radiation Oncology

## 2015-04-17 VITALS — BP 134/76 | Temp 97.6°F | Ht 65.0 in | Wt 212.4 lb

## 2015-04-17 DIAGNOSIS — D0511 Intraductal carcinoma in situ of right breast: Secondary | ICD-10-CM | POA: Diagnosis not present

## 2015-04-17 NOTE — Progress Notes (Signed)
Radiation Oncology Follow up Note  Name: Gail Mccarthy   Date:   04/17/2015 MRN:  [id number] DOB: 1941/01/15    This 75 y.o. 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 Status post whole breast radiation to the right breast  REFERRING PROVIDER: No ref. 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 She is seen today in routine follow-up and is doing well.. She has recently diagnosed with lupus with a positive ANA. She is being treated by Dr. Ellin Mayhew that. She specifically denies breast tenderness cough or bone pain. She is currently on tamoxifen tolerating that well without side effect. Last mammogram was in May 2016 she is scheduled for another 1 this April.  COMPLICATIONS OF TREATMENT: none  FOLLOW UP COMPLIANCE: keeps appointments   PHYSICAL EXAM:  BP 134/76 mmHg  Temp(Src) 97.6 F (36.4 C)  Ht 5\' 5"  (1.651 m)  Wt 212 lb 6.6 oz (96.35 kg)  BMI 35.35 kg/m2 Lungs are clear to A&P cardiac examination essentially unremarkable with regular rate and rhythm. No dominant mass or nodularity is noted in either breast in 2 positions examined. Incision is well-healed. No axillary or supraclavicular adenopathy is appreciated. Cosmetic result is excellent. Well-developed well-nourished patient in NAD. HEENT reveals PERLA, EOMI, discs not visualized.  Oral cavity is clear. No oral mucosal lesions are identified. Neck is clear without evidence of cervical or supraclavicular adenopathy. Lungs are clear to A&P. Cardiac examination is essentially unremarkable with regular rate and rhythm without murmur rub or thrill. Abdomen is benign with no organomegaly or masses noted. Motor sensory and DTR levels are equal and symmetric in the upper  and lower extremities. Cranial nerves II through XII are grossly intact. Proprioception is intact. No peripheral adenopathy or edema is identified. No motor or sensory levels are noted. Crude visual fields are within normal range.  RADIOLOGY RESULTS: Follow-up mammograms are reviewed  PLAN: At the present time she is doing well with no evidence of disease. Currently being treated for lupus sounds like early stage. I'm please were overall progress from a breast standpoint. I've asked to see her back in 6 months for follow-up. She continues on tamoxifen without side effect. Patient knows to call sooner with any concerns.  I would like to take this opportunity for allowing me to participate in the care of your patient.Armstead Peaks., MD

## 2015-04-27 DIAGNOSIS — R768 Other specified abnormal immunological findings in serum: Secondary | ICD-10-CM | POA: Insufficient documentation

## 2015-04-27 DIAGNOSIS — N183 Chronic kidney disease, stage 3 (moderate): Secondary | ICD-10-CM | POA: Diagnosis not present

## 2015-04-27 DIAGNOSIS — R21 Rash and other nonspecific skin eruption: Secondary | ICD-10-CM | POA: Diagnosis not present

## 2015-05-06 DIAGNOSIS — E785 Hyperlipidemia, unspecified: Secondary | ICD-10-CM | POA: Diagnosis not present

## 2015-05-06 DIAGNOSIS — I10 Essential (primary) hypertension: Secondary | ICD-10-CM | POA: Diagnosis not present

## 2015-05-06 DIAGNOSIS — N183 Chronic kidney disease, stage 3 (moderate): Secondary | ICD-10-CM | POA: Diagnosis not present

## 2015-05-06 DIAGNOSIS — E119 Type 2 diabetes mellitus without complications: Secondary | ICD-10-CM | POA: Diagnosis not present

## 2015-05-08 DIAGNOSIS — E119 Type 2 diabetes mellitus without complications: Secondary | ICD-10-CM | POA: Diagnosis not present

## 2015-05-08 DIAGNOSIS — I10 Essential (primary) hypertension: Secondary | ICD-10-CM | POA: Diagnosis not present

## 2015-05-08 DIAGNOSIS — E559 Vitamin D deficiency, unspecified: Secondary | ICD-10-CM | POA: Diagnosis not present

## 2015-05-08 DIAGNOSIS — N183 Chronic kidney disease, stage 3 (moderate): Secondary | ICD-10-CM | POA: Diagnosis not present

## 2015-05-14 DIAGNOSIS — I10 Essential (primary) hypertension: Secondary | ICD-10-CM | POA: Diagnosis not present

## 2015-05-14 DIAGNOSIS — N183 Chronic kidney disease, stage 3 (moderate): Secondary | ICD-10-CM | POA: Diagnosis not present

## 2015-05-14 DIAGNOSIS — Z23 Encounter for immunization: Secondary | ICD-10-CM | POA: Diagnosis not present

## 2015-05-14 DIAGNOSIS — E785 Hyperlipidemia, unspecified: Secondary | ICD-10-CM | POA: Diagnosis not present

## 2015-05-14 DIAGNOSIS — E119 Type 2 diabetes mellitus without complications: Secondary | ICD-10-CM | POA: Diagnosis not present

## 2015-05-14 DIAGNOSIS — R768 Other specified abnormal immunological findings in serum: Secondary | ICD-10-CM | POA: Diagnosis not present

## 2015-06-16 DIAGNOSIS — M069 Rheumatoid arthritis, unspecified: Secondary | ICD-10-CM | POA: Diagnosis not present

## 2015-06-16 DIAGNOSIS — Z79899 Other long term (current) drug therapy: Secondary | ICD-10-CM | POA: Diagnosis not present

## 2015-07-10 DIAGNOSIS — H353131 Nonexudative age-related macular degeneration, bilateral, early dry stage: Secondary | ICD-10-CM | POA: Diagnosis not present

## 2015-07-22 ENCOUNTER — Other Ambulatory Visit: Payer: Self-pay | Admitting: Oncology

## 2015-07-29 ENCOUNTER — Ambulatory Visit
Admission: RE | Admit: 2015-07-29 | Discharge: 2015-07-29 | Disposition: A | Discharge status: Home / Self Care | Payer: Medicare Other | Source: Ambulatory Visit | Attending: Oncology | Admitting: Oncology

## 2015-07-29 ENCOUNTER — Other Ambulatory Visit: Payer: Self-pay | Admitting: Oncology

## 2015-07-29 DIAGNOSIS — R928 Other abnormal and inconclusive findings on diagnostic imaging of breast: Secondary | ICD-10-CM | POA: Diagnosis not present

## 2015-07-29 DIAGNOSIS — Z853 Personal history of malignant neoplasm of breast: Secondary | ICD-10-CM | POA: Diagnosis not present

## 2015-07-29 DIAGNOSIS — D0511 Intraductal carcinoma in situ of right breast: Secondary | ICD-10-CM

## 2015-08-14 DIAGNOSIS — H353131 Nonexudative age-related macular degeneration, bilateral, early dry stage: Secondary | ICD-10-CM | POA: Diagnosis not present

## 2015-08-28 DIAGNOSIS — E119 Type 2 diabetes mellitus without complications: Secondary | ICD-10-CM | POA: Diagnosis not present

## 2015-08-28 DIAGNOSIS — N2581 Secondary hyperparathyroidism of renal origin: Secondary | ICD-10-CM | POA: Diagnosis not present

## 2015-08-28 DIAGNOSIS — E559 Vitamin D deficiency, unspecified: Secondary | ICD-10-CM | POA: Diagnosis not present

## 2015-08-28 DIAGNOSIS — N183 Chronic kidney disease, stage 3 (moderate): Secondary | ICD-10-CM | POA: Diagnosis not present

## 2015-08-28 DIAGNOSIS — I10 Essential (primary) hypertension: Secondary | ICD-10-CM | POA: Diagnosis not present

## 2015-09-02 ENCOUNTER — Other Ambulatory Visit: Payer: Self-pay | Admitting: *Deleted

## 2015-09-03 ENCOUNTER — Inpatient Hospital Stay: Payer: Medicare Other | Attending: Oncology

## 2015-09-03 ENCOUNTER — Inpatient Hospital Stay (HOSPITAL_BASED_OUTPATIENT_CLINIC_OR_DEPARTMENT_OTHER): Payer: Medicare Other | Admitting: Oncology

## 2015-09-03 VITALS — BP 150/85 | HR 59 | Temp 95.7°F | Resp 18 | Wt 215.2 lb

## 2015-09-03 DIAGNOSIS — Z79810 Long term (current) use of selective estrogen receptor modulators (SERMs): Secondary | ICD-10-CM

## 2015-09-03 DIAGNOSIS — Z923 Personal history of irradiation: Secondary | ICD-10-CM | POA: Insufficient documentation

## 2015-09-03 DIAGNOSIS — D0511 Intraductal carcinoma in situ of right breast: Secondary | ICD-10-CM

## 2015-09-03 DIAGNOSIS — Z79899 Other long term (current) drug therapy: Secondary | ICD-10-CM | POA: Insufficient documentation

## 2015-09-03 DIAGNOSIS — Z7982 Long term (current) use of aspirin: Secondary | ICD-10-CM | POA: Diagnosis not present

## 2015-09-03 DIAGNOSIS — K219 Gastro-esophageal reflux disease without esophagitis: Secondary | ICD-10-CM | POA: Diagnosis not present

## 2015-09-03 DIAGNOSIS — Z85528 Personal history of other malignant neoplasm of kidney: Secondary | ICD-10-CM

## 2015-09-03 DIAGNOSIS — I10 Essential (primary) hypertension: Secondary | ICD-10-CM | POA: Insufficient documentation

## 2015-09-03 DIAGNOSIS — E7800 Pure hypercholesterolemia, unspecified: Secondary | ICD-10-CM

## 2015-09-03 DIAGNOSIS — E119 Type 2 diabetes mellitus without complications: Secondary | ICD-10-CM | POA: Diagnosis not present

## 2015-09-03 DIAGNOSIS — Z170 Estrogen receptor positive status [ER+]: Secondary | ICD-10-CM

## 2015-09-03 DIAGNOSIS — K589 Irritable bowel syndrome without diarrhea: Secondary | ICD-10-CM

## 2015-09-03 DIAGNOSIS — Z803 Family history of malignant neoplasm of breast: Secondary | ICD-10-CM | POA: Diagnosis not present

## 2015-09-03 NOTE — Progress Notes (Signed)
States is feeling well. Offers no complaints. Last mammo May 2017.

## 2015-09-15 NOTE — Progress Notes (Signed)
[place]  [phone] [phone]) [phone]  ID: Gail Mccarthy OB: December 27, 1940  MR#: [id number]  [id number]  Patient Care Team: Leonel Ramsay, MD as PCP - General (Infectious Diseases)  CHIEF COMPLAINT:  Chief Complaint  Patient presents with  . DCIS    INTERVAL HISTORY: Patient returns to clinic today for routine six-month follow-up. She continues to tolerate tamoxifen well without significant side effects. She has no neurologic complaints.  She denies any recent fevers or illnesses.  She has a good appetite and denies weight loss.  She denies any chest pain or shortness of breath.  She denies any nausea, vomiting, constipation, or diarrhea.  She has no urinary complaints.  Patient offers no specific complaints today.   REVIEW OF SYSTEMS:   Review of Systems  Constitutional: Negative.  Negative for fever, weight loss and malaise/fatigue.  Respiratory: Negative.  Negative for cough and shortness of breath.   Cardiovascular: Negative.  Negative for chest pain.  Gastrointestinal: Negative.  Negative for abdominal pain.  Genitourinary: Negative.   Musculoskeletal: Negative.   Neurological: Negative.  Negative for weakness.  Psychiatric/Behavioral: Negative.     As per HPI. Otherwise, a complete review of systems is negatve.  PAST MEDICAL HISTORY: Past Medical History  Diagnosis Date  . Radiation 2014    BREAST CA  . Diabetes mellitus without complication ([place])   . Hypertension   . High cholesterol   . Renal insufficiency   . Diverticulosis   . Liver mass   . Neoplasm of kidney   . Hepatic steatosis   . History of partial mastectomy of right breast   . Dysrhythmia   . Dizziness     MEDICINE RELATED  . History of hiatal hernia   . GERD (gastroesophageal reflux disease)   . IBS (irritable bowel syndrome)   . Cough     CHRONIC  . Heart palpitations   . Renal cell carcinoma ([employer]) 5005  . Breast cancer ([place]) 2014    RT  LUMPECTOMY    PAST SURGICAL HISTORY: Past Surgical History  Procedure Laterality Date  . Appendectomy    . Cholecystectomy    . Tonsillectomy    . Eye surgery      macular hole repair  . Colonoscopy with propofol N/A 10/08/2014    Procedure: COLONOSCOPY WITH PROPOFOL;  Surgeon: Manya Silvas, MD;  Location: [facility] ENDOSCOPY;  Service: Endoscopy;  Laterality: N/A;  . Esophagogastroduodenoscopy (egd) with propofol N/A 10/08/2014    Procedure: ESOPHAGOGASTRODUODENOSCOPY (EGD) WITH PROPOFOL;  Surgeon: Manya Silvas, MD;  Location: [facility] ENDOSCOPY;  Service: Endoscopy;  Laterality: N/A;  . Breast biopsy Right 2014    RIGHT PARTIAL MASTECTOMY 2014  . Nephrectomt Right   . Leg surgery Right     FX PINS,SCREWS  . Cataract extraction w/phaco Right 10/27/2014    Procedure: CATARACT EXTRACTION PHACO AND INTRAOCULAR LENS PLACEMENT (IOC);  Surgeon: Estill Cotta, MD;  Location: [facility] ORS;  Service: Ophthalmology;  Laterality: Right;  [place]: 01:30.0 AP%: 25.7 CDE: 40.11 Fluid lot# [id number]    FAMILY HISTORY Family History  Problem Relation Age of Onset  . Diabetes Other   . COPD Other   . Colitis Other   . Breast cancer Paternal Aunt        ADVANCED DIRECTIVES:    HEALTH MAINTENANCE: Social History  Substance Use Topics  . Smoking status: Never Smoker   . Smokeless tobacco: Not on file  . Alcohol Use: No     Colonoscopy:  PAP:  Bone density:  Lipid panel:  Allergies  Allergen Reactions  . Cefdinir Nausea And Vomiting  . Codeine Nausea Only    Current Outpatient Prescriptions  Medication Sig Dispense Refill  . acetaminophen (TYLENOL) 500 MG tablet Take 1,000 mg by mouth every 6 (six) hours as needed.    Marland Kitchen aspirin 81 MG tablet Take 81 mg by mouth daily.    . benazepril (LOTENSIN) 10 MG tablet Take 10 mg by mouth daily.    . hydroxychloroquine (PLAQUENIL) 200 MG tablet Take 400 mg by mouth daily.     . Multiple Vitamins-Minerals (PRESERVISION AREDS) TABS Take 60 mg by  mouth daily.    . pantoprazole (PROTONIX) 40 MG tablet Take 40 mg by mouth daily.    . sucralfate (CARAFATE) 1 G tablet Take 1 g by mouth 4 (four) times daily -  with meals and at bedtime.    . tamoxifen (NOLVADEX) 20 MG tablet Take 1 tablet (20 mg total) by mouth daily. 90 tablet 0  . triamterene-hydrochlorothiazide (MAXZIDE-25) 37.5-25 MG per tablet Take 1 tablet by mouth daily.     No current facility-administered medications for this visit.    OBJECTIVE: Filed Vitals:   09/03/15 1109  BP: 150/85  Pulse: 59  Temp: 95.7 F (35.4 C)  Resp: 18     Body mass index is 35.81 kg/(m^2).    ECOG FS:0 - Asymptomatic  General: Well-developed, well-nourished, no acute distress. Eyes: anicteric sclera. Breasts: Bilateral breast and axilla without lumps or masses. Lungs: Clear to auscultation bilaterally. Heart: Regular rate and rhythm. No rubs, murmurs, or gallops. Abdomen: Soft, nontender, nondistended. No organomegaly noted, normoactive bowel sounds. Musculoskeletal: No edema, cyanosis, or clubbing. Neuro: Alert, answering all questions appropriately. Cranial nerves grossly intact. Skin: No rashes or petechiae noted. Psych: Normal affect.  LAB RESULTS:  Lab Results  Component Value Date   K 4.1 01/10/2014    Lab Results  Component Value Date   WBC 6.5 11/08/2012   NEUTROABS 4.5 11/08/2012   HGB 14.6 11/08/2012   HCT 43.7 11/08/2012   MCV 80 11/08/2012   PLT 195 11/08/2012     STUDIES: No results found.  ASSESSMENT: DCIS, right breast.  PLAN:    1.  DCIS: No evidence of disease.  Continue tamoxifen completing in August 2019. Patient most recent right breast mammogram on Jul 29, 2015 was reported as BI-RADS 2, repeat in May 2017. No intervention is needed at this time. Return to clinic in 6 months for routine evaluation.  2. Hypertension: Blood pressure mildly elevated today. Continue current medications as prescribed.   Patient expressed understanding and was in  agreement with this plan. She also understands that She can call clinic at any time with any questions, concerns, or complaints.   DCIS (ductal carcinoma in situ)   Staging form: Breast, AJCC 7th Edition     Clinical stage from 09/21/2014: Stage 0 (Tis (DCIS), N0, M0) - Signed by Lloyd Huger, MD on 09/21/2014   Lloyd Huger, MD   09/15/2015 8:46 AM

## 2015-10-20 ENCOUNTER — Other Ambulatory Visit: Payer: Self-pay | Admitting: *Deleted

## 2015-10-20 MED ORDER — TAMOXIFEN CITRATE 20 MG PO TABS: ORAL_TABLET | ORAL | 3 refills | Status: DC

## 2015-10-26 DIAGNOSIS — N183 Chronic kidney disease, stage 3 (moderate): Secondary | ICD-10-CM | POA: Diagnosis not present

## 2015-10-26 DIAGNOSIS — R768 Other specified abnormal immunological findings in serum: Secondary | ICD-10-CM | POA: Diagnosis not present

## 2015-10-26 DIAGNOSIS — M150 Primary generalized (osteo)arthritis: Secondary | ICD-10-CM | POA: Diagnosis not present

## 2015-11-05 DIAGNOSIS — R768 Other specified abnormal immunological findings in serum: Secondary | ICD-10-CM | POA: Diagnosis not present

## 2015-11-05 DIAGNOSIS — E119 Type 2 diabetes mellitus without complications: Secondary | ICD-10-CM | POA: Diagnosis not present

## 2015-11-05 DIAGNOSIS — N183 Chronic kidney disease, stage 3 (moderate): Secondary | ICD-10-CM | POA: Diagnosis not present

## 2015-11-05 DIAGNOSIS — E785 Hyperlipidemia, unspecified: Secondary | ICD-10-CM | POA: Diagnosis not present

## 2015-11-13 DIAGNOSIS — E119 Type 2 diabetes mellitus without complications: Secondary | ICD-10-CM | POA: Diagnosis not present

## 2015-11-13 DIAGNOSIS — I10 Essential (primary) hypertension: Secondary | ICD-10-CM | POA: Diagnosis not present

## 2015-11-13 DIAGNOSIS — R160 Hepatomegaly, not elsewhere classified: Secondary | ICD-10-CM | POA: Diagnosis not present

## 2015-11-13 DIAGNOSIS — E785 Hyperlipidemia, unspecified: Secondary | ICD-10-CM | POA: Diagnosis not present

## 2015-11-13 DIAGNOSIS — N183 Chronic kidney disease, stage 3 (moderate): Secondary | ICD-10-CM | POA: Diagnosis not present

## 2015-12-08 DIAGNOSIS — Z79899 Other long term (current) drug therapy: Secondary | ICD-10-CM | POA: Diagnosis not present

## 2015-12-08 DIAGNOSIS — M069 Rheumatoid arthritis, unspecified: Secondary | ICD-10-CM | POA: Diagnosis not present

## 2015-12-11 DIAGNOSIS — I10 Essential (primary) hypertension: Secondary | ICD-10-CM | POA: Diagnosis not present

## 2015-12-11 DIAGNOSIS — E559 Vitamin D deficiency, unspecified: Secondary | ICD-10-CM | POA: Diagnosis not present

## 2015-12-11 DIAGNOSIS — N2581 Secondary hyperparathyroidism of renal origin: Secondary | ICD-10-CM | POA: Diagnosis not present

## 2015-12-11 DIAGNOSIS — N183 Chronic kidney disease, stage 3 (moderate): Secondary | ICD-10-CM | POA: Diagnosis not present

## 2015-12-11 DIAGNOSIS — E119 Type 2 diabetes mellitus without complications: Secondary | ICD-10-CM | POA: Diagnosis not present

## 2015-12-22 DIAGNOSIS — H353131 Nonexudative age-related macular degeneration, bilateral, early dry stage: Secondary | ICD-10-CM | POA: Diagnosis not present

## 2015-12-25 DIAGNOSIS — J40 Bronchitis, not specified as acute or chronic: Secondary | ICD-10-CM | POA: Diagnosis not present

## 2016-01-12 DIAGNOSIS — Z85528 Personal history of other malignant neoplasm of kidney: Secondary | ICD-10-CM | POA: Diagnosis not present

## 2016-01-12 DIAGNOSIS — Z01419 Encounter for gynecological examination (general) (routine) without abnormal findings: Secondary | ICD-10-CM | POA: Diagnosis not present

## 2016-01-12 DIAGNOSIS — Z1211 Encounter for screening for malignant neoplasm of colon: Secondary | ICD-10-CM | POA: Diagnosis not present

## 2016-01-12 DIAGNOSIS — Z08 Encounter for follow-up examination after completed treatment for malignant neoplasm: Secondary | ICD-10-CM | POA: Diagnosis not present

## 2016-01-12 DIAGNOSIS — Z779 Other contact with and (suspected) exposures hazardous to health: Secondary | ICD-10-CM | POA: Diagnosis not present

## 2016-01-15 ENCOUNTER — Encounter: Payer: Self-pay | Admitting: *Deleted

## 2016-01-15 DIAGNOSIS — Z23 Encounter for immunization: Secondary | ICD-10-CM | POA: Diagnosis not present

## 2016-03-06 NOTE — Progress Notes (Signed)
[place]  [phone] [phone]  ID: Gail Mccarthy OB: 02-Mar-1941  MR#: [id number]  [id number]  Patient Care Team: Leonel Ramsay, MD as PCP - General (Infectious Diseases)  CHIEF COMPLAINT: DCIS of right breast  INTERVAL HISTORY: Patient returns to clinic today for routine six-month follow-up. She continues to tolerate tamoxifen well without significant side effects. She has no neurologic complaints.  She denies any recent fevers or illnesses.  She has a good appetite and denies weight loss.  She denies any chest pain or shortness of breath.  She denies any nausea, vomiting, constipation, or diarrhea.  She has no urinary complaints.  Patient offers no specific complaints today.   REVIEW OF SYSTEMS:   Review of Systems  Constitutional: Negative.  Negative for fever, malaise/fatigue and weight loss.  Respiratory: Negative.  Negative for cough and shortness of breath.   Cardiovascular: Negative.  Negative for chest pain and leg swelling.  Gastrointestinal: Negative.  Negative for abdominal pain.  Genitourinary: Negative.   Musculoskeletal: Negative.   Neurological: Negative.  Negative for weakness.  Psychiatric/Behavioral: Negative.  The patient is not nervous/anxious.     As per HPI. Otherwise, a complete review of systems is negative.  PAST MEDICAL HISTORY: Past Medical History:  Diagnosis Date  . Breast cancer ([place]) 2014   RT LUMPECTOMY  . Cough    CHRONIC  . Diabetes mellitus without complication ([place])   . Diverticulosis   . Dizziness    MEDICINE RELATED  . Dysrhythmia   . GERD (gastroesophageal reflux disease)   . Heart palpitations   . Hepatic steatosis   . High cholesterol   . History of hiatal hernia   . History of partial mastectomy of right breast   . Hypertension   . IBS (irritable bowel syndrome)   . Liver mass   . Neoplasm of kidney   . Radiation 2014   BREAST CA  . Renal cell carcinoma ([place]) 5005    . Renal insufficiency     PAST SURGICAL HISTORY: Past Surgical History:  Procedure Laterality Date  . APPENDECTOMY    . BREAST BIOPSY Right 2014   RIGHT PARTIAL MASTECTOMY 2014  . CATARACT EXTRACTION W/PHACO Right 10/27/2014   Procedure: CATARACT EXTRACTION PHACO AND INTRAOCULAR LENS PLACEMENT (IOC);  Surgeon: Estill Cotta, MD;  Location: [facility] ORS;  Service: Ophthalmology;  Laterality: Right;  [place]: 01:30.0 AP%: 25.7 CDE: 40.11 Fluid lot# [id number]  . CHOLECYSTECTOMY    . COLONOSCOPY WITH PROPOFOL N/A 10/08/2014   Procedure: COLONOSCOPY WITH PROPOFOL;  Surgeon: Manya Silvas, MD;  Location: [facility] ENDOSCOPY;  Service: Endoscopy;  Laterality: N/A;  . ESOPHAGOGASTRODUODENOSCOPY (EGD) WITH PROPOFOL N/A 10/08/2014   Procedure: ESOPHAGOGASTRODUODENOSCOPY (EGD) WITH PROPOFOL;  Surgeon: Manya Silvas, MD;  Location: [facility] ENDOSCOPY;  Service: Endoscopy;  Laterality: N/A;  . EYE SURGERY     macular hole repair  . LEG SURGERY Right    FX PINS,SCREWS  . NEPHRECTOMT Right   . TONSILLECTOMY      FAMILY HISTORY Family History  Problem Relation Age of Onset  . Diabetes Other   . COPD Other   . Colitis Other   . Breast cancer Paternal Aunt        ADVANCED DIRECTIVES:    HEALTH MAINTENANCE: Social History  Substance Use Topics  . Smoking status: Never Smoker  . Smokeless tobacco: Not on file  . Alcohol use No     Colonoscopy:  PAP:  Bone density:  Lipid panel:  Allergies  Allergen Reactions  . Cefdinir Nausea And Vomiting  . Codeine Nausea Only    Current Outpatient Prescriptions  Medication Sig Dispense Refill  . acetaminophen (TYLENOL) 500 MG tablet Take 1,000 mg by mouth every 6 (six) hours as needed.    Marland Kitchen aspirin 81 MG tablet Take 81 mg by mouth daily.    . benazepril (LOTENSIN) 10 MG tablet Take 10 mg by mouth daily.    . hydroxychloroquine (PLAQUENIL) 200 MG tablet Take 400 mg by mouth daily.     . Multiple Vitamins-Minerals (PRESERVISION AREDS) TABS Take 60  mg by mouth daily.    . pantoprazole (PROTONIX) 40 MG tablet Take 40 mg by mouth daily.    . sucralfate (CARAFATE) 1 G tablet Take 1 g by mouth 4 (four) times daily -  with meals and at bedtime.    . tamoxifen (NOLVADEX) 20 MG tablet Take 1 tablet (20 mg total) by mouth daily. 90 tablet 3  . triamterene-hydrochlorothiazide (MAXZIDE-25) 37.5-25 MG per tablet Take 1 tablet by mouth daily.     No current facility-administered medications for this visit.     OBJECTIVE: Vitals:   03/07/16 1115  BP: (!) 164/84  Pulse: 61  Resp: 18  Temp: 98.6 F (37 C)     Body mass index is 38.85 kg/m.    ECOG FS:0 - Asymptomatic  General: Well-developed, well-nourished, no acute distress. Eyes: anicteric sclera. Breasts: Bilateral breast and axilla without lumps or masses. Patient requested exam be deferred today. Lungs: Clear to auscultation bilaterally. Heart: Regular rate and rhythm. No rubs, murmurs, or gallops. Abdomen: Soft, nontender, nondistended. No organomegaly noted, normoactive bowel sounds. Musculoskeletal: No edema, cyanosis, or clubbing. Neuro: Alert, answering all questions appropriately. Cranial nerves grossly intact. Skin: No rashes or petechiae noted. Psych: Normal affect.  LAB RESULTS:  Lab Results  Component Value Date   K 4.1 01/10/2014    Lab Results  Component Value Date   WBC 6.5 11/08/2012   NEUTROABS 4.5 11/08/2012   HGB 14.6 11/08/2012   HCT 43.7 11/08/2012   MCV 80 11/08/2012   PLT 195 11/08/2012     STUDIES: No results found.  ASSESSMENT: DCIS, right breast.  PLAN:    1.  DCIS of right breast: No evidence of disease.  Continue tamoxifen completing in August 2019. Patient most recent right breast mammogram on Jul 29, 2015 was reported as BI-RADS 2, repeat in May 2018. No intervention is needed at this time. Return to clinic in 6 months for routine evaluation.  2. Hypertension: Blood pressure mildly elevated today. Continue current medications as  prescribed.   Patient expressed understanding and was in agreement with this plan. She also understands that She can call clinic at any time with any questions, concerns, or complaints.   DCIS (ductal carcinoma in situ)   Staging form: Breast, AJCC 7th Edition     Clinical stage from 09/21/2014: Stage 0 (Tis (DCIS), N0, M0) - Signed by Lloyd Huger, MD on 09/21/2014   Lloyd Huger, MD   03/08/2016 11:14 AM

## 2016-03-07 ENCOUNTER — Inpatient Hospital Stay: Payer: Medicare Other | Attending: Oncology | Admitting: Oncology

## 2016-03-07 VITALS — BP 164/84 | HR 61 | Temp 98.6°F | Resp 18 | Wt 233.5 lb

## 2016-03-07 DIAGNOSIS — K449 Diaphragmatic hernia without obstruction or gangrene: Secondary | ICD-10-CM | POA: Diagnosis not present

## 2016-03-07 DIAGNOSIS — Z79899 Other long term (current) drug therapy: Secondary | ICD-10-CM | POA: Diagnosis not present

## 2016-03-07 DIAGNOSIS — I10 Essential (primary) hypertension: Secondary | ICD-10-CM | POA: Insufficient documentation

## 2016-03-07 DIAGNOSIS — K589 Irritable bowel syndrome without diarrhea: Secondary | ICD-10-CM | POA: Insufficient documentation

## 2016-03-07 DIAGNOSIS — N189 Chronic kidney disease, unspecified: Secondary | ICD-10-CM | POA: Diagnosis not present

## 2016-03-07 DIAGNOSIS — K760 Fatty (change of) liver, not elsewhere classified: Secondary | ICD-10-CM | POA: Diagnosis not present

## 2016-03-07 DIAGNOSIS — E7800 Pure hypercholesterolemia, unspecified: Secondary | ICD-10-CM | POA: Insufficient documentation

## 2016-03-07 DIAGNOSIS — E119 Type 2 diabetes mellitus without complications: Secondary | ICD-10-CM | POA: Diagnosis not present

## 2016-03-07 DIAGNOSIS — Z803 Family history of malignant neoplasm of breast: Secondary | ICD-10-CM | POA: Insufficient documentation

## 2016-03-07 DIAGNOSIS — Z85528 Personal history of other malignant neoplasm of kidney: Secondary | ICD-10-CM | POA: Insufficient documentation

## 2016-03-07 DIAGNOSIS — Z170 Estrogen receptor positive status [ER+]: Secondary | ICD-10-CM | POA: Insufficient documentation

## 2016-03-07 DIAGNOSIS — Z7982 Long term (current) use of aspirin: Secondary | ICD-10-CM | POA: Insufficient documentation

## 2016-03-07 DIAGNOSIS — R160 Hepatomegaly, not elsewhere classified: Secondary | ICD-10-CM | POA: Diagnosis not present

## 2016-03-07 DIAGNOSIS — D0511 Intraductal carcinoma in situ of right breast: Secondary | ICD-10-CM | POA: Diagnosis not present

## 2016-03-07 DIAGNOSIS — R42 Dizziness and giddiness: Secondary | ICD-10-CM | POA: Diagnosis not present

## 2016-03-07 DIAGNOSIS — K219 Gastro-esophageal reflux disease without esophagitis: Secondary | ICD-10-CM | POA: Diagnosis not present

## 2016-03-07 DIAGNOSIS — I129 Hypertensive chronic kidney disease with stage 1 through stage 4 chronic kidney disease, or unspecified chronic kidney disease: Secondary | ICD-10-CM | POA: Insufficient documentation

## 2016-03-07 DIAGNOSIS — Z9011 Acquired absence of right breast and nipple: Secondary | ICD-10-CM | POA: Diagnosis not present

## 2016-03-07 DIAGNOSIS — K5790 Diverticulosis of intestine, part unspecified, without perforation or abscess without bleeding: Secondary | ICD-10-CM

## 2016-03-07 DIAGNOSIS — Z79811 Long term (current) use of aromatase inhibitors: Secondary | ICD-10-CM | POA: Insufficient documentation

## 2016-03-07 DIAGNOSIS — I499 Cardiac arrhythmia, unspecified: Secondary | ICD-10-CM | POA: Diagnosis not present

## 2016-03-07 DIAGNOSIS — R002 Palpitations: Secondary | ICD-10-CM | POA: Diagnosis not present

## 2016-03-07 NOTE — Progress Notes (Signed)
Patient is here for follow up, she is doing well 

## 2016-03-16 ENCOUNTER — Other Ambulatory Visit
Admission: RE | Admit: 2016-03-16 | Discharge: 2016-03-16 | Disposition: A | Discharge status: Home / Self Care | Payer: Medicare Other | Source: Ambulatory Visit | Attending: Ophthalmology | Admitting: Ophthalmology

## 2016-03-16 DIAGNOSIS — H353131 Nonexudative age-related macular degeneration, bilateral, early dry stage: Secondary | ICD-10-CM | POA: Diagnosis not present

## 2016-03-16 DIAGNOSIS — G453 Amaurosis fugax: Secondary | ICD-10-CM | POA: Diagnosis not present

## 2016-03-16 LAB — CBC WITH DIFFERENTIAL/PLATELET
Basophils Absolute: 0.1 10*3/uL (ref 0–0.1)
Basophils Relative: 1 %
EOS ABS: 0.3 10*3/uL (ref 0–0.7)
EOS PCT: 4 %
HCT: 40.2 % (ref 35.0–47.0)
Hemoglobin: 13.4 g/dL (ref 12.0–16.0)
LYMPHS ABS: 1.4 10*3/uL (ref 1.0–3.6)
LYMPHS PCT: 20 %
MCH: 27.6 pg (ref 26.0–34.0)
MCHC: 33.4 g/dL (ref 32.0–36.0)
MCV: 82.7 fL (ref 80.0–100.0)
MONOS PCT: 5 %
Monocytes Absolute: 0.4 10*3/uL (ref 0.2–0.9)
Neutro Abs: 4.9 10*3/uL (ref 1.4–6.5)
Neutrophils Relative %: 70 %
PLATELETS: 154 10*3/uL (ref 150–440)
RBC: 4.86 MIL/uL (ref 3.80–5.20)
RDW: 14.6 % — ABNORMAL HIGH (ref 11.5–14.5)
WBC: 7.1 10*3/uL (ref 3.6–11.0)

## 2016-03-16 LAB — C-REACTIVE PROTEIN

## 2016-03-16 LAB — SEDIMENTATION RATE: Sed Rate: 4 mm/hr (ref 0–30)

## 2016-03-29 DIAGNOSIS — E119 Type 2 diabetes mellitus without complications: Secondary | ICD-10-CM | POA: Diagnosis not present

## 2016-03-29 DIAGNOSIS — N183 Chronic kidney disease, stage 3 (moderate): Secondary | ICD-10-CM | POA: Diagnosis not present

## 2016-03-29 DIAGNOSIS — M545 Low back pain: Secondary | ICD-10-CM | POA: Diagnosis not present

## 2016-04-14 DIAGNOSIS — N2581 Secondary hyperparathyroidism of renal origin: Secondary | ICD-10-CM | POA: Diagnosis not present

## 2016-04-14 DIAGNOSIS — N183 Chronic kidney disease, stage 3 (moderate): Secondary | ICD-10-CM | POA: Diagnosis not present

## 2016-04-14 DIAGNOSIS — E119 Type 2 diabetes mellitus without complications: Secondary | ICD-10-CM | POA: Diagnosis not present

## 2016-04-14 DIAGNOSIS — I10 Essential (primary) hypertension: Secondary | ICD-10-CM | POA: Diagnosis not present

## 2016-04-25 DIAGNOSIS — N183 Chronic kidney disease, stage 3 (moderate): Secondary | ICD-10-CM | POA: Diagnosis not present

## 2016-04-25 DIAGNOSIS — E119 Type 2 diabetes mellitus without complications: Secondary | ICD-10-CM | POA: Diagnosis not present

## 2016-04-25 DIAGNOSIS — E785 Hyperlipidemia, unspecified: Secondary | ICD-10-CM | POA: Diagnosis not present

## 2016-04-25 DIAGNOSIS — I10 Essential (primary) hypertension: Secondary | ICD-10-CM | POA: Diagnosis not present

## 2016-04-27 DIAGNOSIS — R768 Other specified abnormal immunological findings in serum: Secondary | ICD-10-CM | POA: Diagnosis not present

## 2016-04-27 DIAGNOSIS — N183 Chronic kidney disease, stage 3 (moderate): Secondary | ICD-10-CM | POA: Diagnosis not present

## 2016-04-27 DIAGNOSIS — M150 Primary generalized (osteo)arthritis: Secondary | ICD-10-CM | POA: Diagnosis not present

## 2016-05-04 DIAGNOSIS — Z0000 Encounter for general adult medical examination without abnormal findings: Secondary | ICD-10-CM | POA: Diagnosis not present

## 2016-05-04 DIAGNOSIS — K760 Fatty (change of) liver, not elsewhere classified: Secondary | ICD-10-CM | POA: Diagnosis not present

## 2016-05-04 DIAGNOSIS — E1122 Type 2 diabetes mellitus with diabetic chronic kidney disease: Secondary | ICD-10-CM | POA: Diagnosis not present

## 2016-05-04 DIAGNOSIS — E6609 Other obesity due to excess calories: Secondary | ICD-10-CM | POA: Diagnosis not present

## 2016-05-04 DIAGNOSIS — I10 Essential (primary) hypertension: Secondary | ICD-10-CM | POA: Diagnosis not present

## 2016-05-04 DIAGNOSIS — N183 Chronic kidney disease, stage 3 (moderate): Secondary | ICD-10-CM | POA: Diagnosis not present

## 2016-05-04 DIAGNOSIS — Z6838 Body mass index (BMI) 38.0-38.9, adult: Secondary | ICD-10-CM | POA: Diagnosis not present

## 2016-05-04 DIAGNOSIS — E785 Hyperlipidemia, unspecified: Secondary | ICD-10-CM | POA: Diagnosis not present

## 2016-05-13 ENCOUNTER — Ambulatory Visit: Payer: Medicare Other | Admitting: Radiation Oncology

## 2016-05-24 ENCOUNTER — Ambulatory Visit
Admission: RE | Admit: 2016-05-24 | Discharge: 2016-05-24 | Disposition: A | Discharge status: Home / Self Care | Payer: Medicare Other | Source: Ambulatory Visit | Attending: Radiation Oncology | Admitting: Radiation Oncology

## 2016-05-24 ENCOUNTER — Encounter: Payer: Self-pay | Admitting: Radiation Oncology

## 2016-05-24 VITALS — HR 88 | Temp 97.5°F | Wt 231.8 lb

## 2016-05-24 DIAGNOSIS — Z170 Estrogen receptor positive status [ER+]: Secondary | ICD-10-CM | POA: Diagnosis not present

## 2016-05-24 DIAGNOSIS — Z79810 Long term (current) use of selective estrogen receptor modulators (SERMs): Secondary | ICD-10-CM | POA: Insufficient documentation

## 2016-05-24 DIAGNOSIS — D0511 Intraductal carcinoma in situ of right breast: Secondary | ICD-10-CM | POA: Diagnosis not present

## 2016-05-24 DIAGNOSIS — Z923 Personal history of irradiation: Secondary | ICD-10-CM | POA: Diagnosis not present

## 2016-05-24 NOTE — Progress Notes (Signed)
patient here for follow up states no changes since last appointment.

## 2016-05-24 NOTE — Progress Notes (Signed)
Radiation Oncology Follow up Note  Name: Gail Mccarthy   Date:   05/24/2016 MRN:  [id number] DOB: [birth year]    This 76 y.o. female presents to the clinic today for 3 and half year follow-up status post whole bradycardia breast radiation to her right breast for ER/PR positive ductal carcinoma in situ.  REFERRING PROVIDER: Leonel Ramsay, MD  HPI: Patient is a 76 year old female now out over 3 and half years having cleaned whole breast radiation to her right breast for ER/PR positive ductal carcinoma in situ status post wide local excision. She is seen today and is doing well. She's currently on tamoxifen tolerating that well without side effect. Last mammogram was back in May 2017 was BI-RADS 2 benign.. She specifically denies breast tenderness cough or bone pain.  COMPLICATIONS OF TREATMENT: none  FOLLOW UP COMPLIANCE: keeps appointments   PHYSICAL EXAM:  Pulse 88   Temp 97.5 F (36.4 C) (Temporal)   Wt 231 lb 13 oz (105.2 kg)   BMI 38.58 kg/m  Well-developed obese female in NAD.Lungs are clear to A&P cardiac examination essentially unremarkable with regular rate and rhythm. No dominant mass or nodularity is noted in either breast in 2 positions examined. Incision is well-healed. No axillary or supraclavicular adenopathy is appreciated. Cosmetic result is excellent. Well-developed well-nourished patient in NAD. HEENT reveals PERLA, EOMI, discs not visualized.  Oral cavity is clear. No oral mucosal lesions are identified. Neck is clear without evidence of cervical or supraclavicular adenopathy. Lungs are clear to A&P. Cardiac examination is essentially unremarkable with regular rate and rhythm without murmur rub or thrill. Abdomen is benign with no organomegaly or masses noted. Motor sensory and DTR levels are equal and symmetric in the upper and lower extremities. Cranial nerves II through XII are grossly intact. Proprioception is intact. No peripheral adenopathy or edema is  identified. No motor or sensory levels are noted. Crude visual fields are within normal range.  RADIOLOGY RESULTS: Recent mammograms are reviewed and compatible with the above-stated findings  PLAN: Present time she continues to do well with no evidence of disease. I'll see her back in 1 year for follow-up and then discontinue follow-up care. Patient knows to call with any concerns. She continues on tamoxifen without side effect.  I would like to take this opportunity to thank you for allowing me to participate in the care of your patient.Armstead Peaks., MD

## 2016-06-04 DIAGNOSIS — R109 Unspecified abdominal pain: Secondary | ICD-10-CM | POA: Diagnosis not present

## 2016-06-07 DIAGNOSIS — B029 Zoster without complications: Secondary | ICD-10-CM | POA: Diagnosis not present

## 2016-06-15 DIAGNOSIS — M329 Systemic lupus erythematosus, unspecified: Secondary | ICD-10-CM | POA: Diagnosis not present

## 2016-06-15 DIAGNOSIS — Z79899 Other long term (current) drug therapy: Secondary | ICD-10-CM | POA: Diagnosis not present

## 2016-06-23 DIAGNOSIS — H353131 Nonexudative age-related macular degeneration, bilateral, early dry stage: Secondary | ICD-10-CM | POA: Diagnosis not present

## 2016-07-29 DIAGNOSIS — H353131 Nonexudative age-related macular degeneration, bilateral, early dry stage: Secondary | ICD-10-CM | POA: Diagnosis not present

## 2016-08-01 ENCOUNTER — Ambulatory Visit
Admission: RE | Admit: 2016-08-01 | Discharge: 2016-08-01 | Disposition: A | Discharge status: Home / Self Care | Payer: Medicare Other | Source: Ambulatory Visit | Attending: Oncology | Admitting: Oncology

## 2016-08-01 DIAGNOSIS — D0511 Intraductal carcinoma in situ of right breast: Secondary | ICD-10-CM

## 2016-08-01 DIAGNOSIS — R928 Other abnormal and inconclusive findings on diagnostic imaging of breast: Secondary | ICD-10-CM | POA: Diagnosis not present

## 2016-08-01 HISTORY — DX: Personal history of irradiation: Z92.3

## 2016-08-12 DIAGNOSIS — N183 Chronic kidney disease, stage 3 (moderate): Secondary | ICD-10-CM | POA: Diagnosis not present

## 2016-08-12 DIAGNOSIS — N2581 Secondary hyperparathyroidism of renal origin: Secondary | ICD-10-CM | POA: Diagnosis not present

## 2016-08-12 DIAGNOSIS — E119 Type 2 diabetes mellitus without complications: Secondary | ICD-10-CM | POA: Diagnosis not present

## 2016-08-12 DIAGNOSIS — I10 Essential (primary) hypertension: Secondary | ICD-10-CM | POA: Diagnosis not present

## 2016-09-06 ENCOUNTER — Inpatient Hospital Stay: Payer: Medicare Other | Admitting: Oncology

## 2016-09-27 NOTE — Progress Notes (Signed)
[place]  [phone]) [phone] [phone]) [phone]  ID: DENYS LABREE OB: 1940-05-30  MR#: [id number]  CWU#:[id number]  Patient Care Team: Leonel Ramsay, MD as PCP - General (Infectious Diseases)  CHIEF COMPLAINT: DCIS of right breast  INTERVAL HISTORY: Patient returns to clinic today for routine six-month follow-up. She continues to tolerate tamoxifen well without significant side effects. She recently had some right arm swelling, but has since resolved. She has no neurologic complaints.  She denies any recent fevers or illnesses.  She has a good appetite and denies weight loss.  She denies any chest pain or shortness of breath.  She denies any nausea, vomiting, constipation, or diarrhea.  She has no urinary complaints.  Patient offers no specific complaints today.   REVIEW OF SYSTEMS:   Review of Systems  Constitutional: Negative.  Negative for fever, malaise/fatigue and weight loss.  Respiratory: Negative.  Negative for cough and shortness of breath.   Cardiovascular: Negative.  Negative for chest pain and leg swelling.  Gastrointestinal: Negative.  Negative for abdominal pain.  Genitourinary: Negative.   Musculoskeletal: Negative.   Neurological: Negative.  Negative for weakness.  Psychiatric/Behavioral: Negative.  The patient is not nervous/anxious.     As per HPI. Otherwise, a complete review of systems is negative.  PAST MEDICAL HISTORY: Past Medical History:  Diagnosis Date  . Breast cancer ([place]) 2014   RT LUMPECTOMY  . Cough    CHRONIC  . Diabetes mellitus without complication ([place])   . Diverticulosis   . Dizziness    MEDICINE RELATED  . Dysrhythmia   . GERD (gastroesophageal reflux disease)   . Heart palpitations   . Hepatic steatosis   . High cholesterol   . History of hiatal hernia   . History of partial mastectomy of right breast   . Hypertension   . IBS (irritable bowel syndrome)   . Liver mass   . Neoplasm of kidney   .  Personal history of radiation therapy 2014   BREAST CA  . Radiation 2014   BREAST CA  . Renal cell carcinoma ([place]) 5005  . Renal insufficiency     PAST SURGICAL HISTORY: Past Surgical History:  Procedure Laterality Date  . APPENDECTOMY    . BREAST BIOPSY Right 2014   POS  . BREAST LUMPECTOMY Right 2014   BREAST CA  . CATARACT EXTRACTION W/PHACO Right 10/27/2014   Procedure: CATARACT EXTRACTION PHACO AND INTRAOCULAR LENS PLACEMENT (IOC);  Surgeon: Estill Cotta, MD;  Location: [facility] ORS;  Service: Ophthalmology;  Laterality: Right;  [place]: 01:30.0 AP%: 25.7 CDE: 40.11 Fluid lot# [id number]  . CHOLECYSTECTOMY    . COLONOSCOPY WITH PROPOFOL N/A 10/08/2014   Procedure: COLONOSCOPY WITH PROPOFOL;  Surgeon: Manya Silvas, MD;  Location: [facility] ENDOSCOPY;  Service: Endoscopy;  Laterality: N/A;  . ESOPHAGOGASTRODUODENOSCOPY (EGD) WITH PROPOFOL N/A 10/08/2014   Procedure: ESOPHAGOGASTRODUODENOSCOPY (EGD) WITH PROPOFOL;  Surgeon: Manya Silvas, MD;  Location[facility] ENDOSCOPY;  Service: Endoscopy;  Laterality: N/A;  . EYE SURGERY     macular hole repair  . LEG SURGERY Right    FX PINS,SCREWS  . NEPHRECTOMT Right   . TONSILLECTOMY      FAMILY HISTORY Family History  Problem Relation Age of Onset  . Diabetes Other   . COPD Other   . Colitis Other   . Breast cancer Paternal Aunt        ADVANCED DIRECTIVES:    HEALTH MAINTENANCE: Social History  Substance Use Topics  . Smoking status:  Never Smoker  . Smokeless tobacco: Never Used  . Alcohol use No     Colonoscopy:  PAP:  Bone density:  Lipid panel:  Allergies  Allergen Reactions  . Cefdinir Nausea And Vomiting  . Codeine Nausea Only  . Gabapentin Nausea Only and Nausea And Vomiting    Current Outpatient Prescriptions  Medication Sig Dispense Refill  . acetaminophen (TYLENOL) 500 MG tablet Take 1,000 mg by mouth every 6 (six) hours as needed.    Marland Kitchen aspirin 81 MG tablet Take 81 mg by mouth daily.    . benazepril  (LOTENSIN) 10 MG tablet Take 10 mg by mouth daily.    . calcitRIOL (ROCALTROL) 0.25 MCG capsule Take 0.25 mcg by mouth daily.    . Cholecalciferol (VITAMIN D) 2000 units tablet Take 2,000 Units by mouth daily.    . hydroxychloroquine (PLAQUENIL) 200 MG tablet Take 400 mg by mouth daily.     . Multiple Vitamins-Minerals (PRESERVISION AREDS) TABS Take 60 mg by mouth daily.    . pantoprazole (PROTONIX) 40 MG tablet Take 40 mg by mouth daily.    . sucralfate (CARAFATE) 1 G tablet Take 1 g by mouth 4 (four) times daily -  with meals and at bedtime.    . tamoxifen (NOLVADEX) 20 MG tablet Take 1 tablet (20 mg total) by mouth daily. 90 tablet 3  . traMADol (ULTRAM) 50 MG tablet Take by mouth.    . triamterene-hydrochlorothiazide (MAXZIDE-25) 37.5-25 MG per tablet Take 1 tablet by mouth daily.     No current facility-administered medications for this visit.     OBJECTIVE: Vitals:   09/28/16 1346  BP: 113/75  Resp: 18  Temp: 97.6 F (36.4 C)     Body mass index is 38.69 kg/m.    ECOG FS:0 - Asymptomatic  General: Well-developed, well-nourished, no acute distress. Eyes: anicteric sclera. Breasts: Bilateral breast and axilla without lumps or masses. Patient requested exam be deferred today. Lungs: Clear to auscultation bilaterally. Heart: Regular rate and rhythm. No rubs, murmurs, or gallops. Abdomen: Soft, nontender, nondistended. No organomegaly noted, normoactive bowel sounds. Musculoskeletal: No edema, cyanosis, or clubbing. Neuro: Alert, answering all questions appropriately. Cranial nerves grossly intact. Skin: No rashes or petechiae noted. Psych: Normal affect.  LAB RESULTS:  Lab Results  Component Value Date   K 4.1 01/10/2014    Lab Results  Component Value Date   WBC 7.1 03/16/2016   NEUTROABS 4.9 03/16/2016   HGB 13.4 03/16/2016   HCT 40.2 03/16/2016   MCV 82.7 03/16/2016   PLT 154 03/16/2016     STUDIES: No results found.  ASSESSMENT: DCIS, right  breast.  PLAN:    1.  DCIS of right breast: No evidence of disease.  Continue tamoxifen completing in August 2019. Patient most recent right breast mammogram on Aug 01, 2016 was reported as BI-RADS 2, repeat in May 2019. No intervention is needed at this time. Return to clinic in 6 months for routine evaluation.  2. Hypertension: Blood pressure mildly elevated today. Continue current medications as prescribed. 3. Right arm swelling: Unclear etiology. Resolved. Possibly lymphedema, monitor.   Patient expressed understanding and was in agreement with this plan. She also understands that She can call clinic at any time with any questions, concerns, or complaints.   DCIS (ductal carcinoma in situ)   Staging form: Breast, AJCC 7th Edition     Clinical stage from 09/21/2014: Stage 0 (Tis (DCIS), N0, M0) - Signed by Lloyd Huger, MD on 09/21/2014  Lloyd Huger, MD   09/30/2016 5:43 PM

## 2016-09-28 ENCOUNTER — Inpatient Hospital Stay: Payer: Medicare Other | Attending: Oncology | Admitting: Oncology

## 2016-09-28 VITALS — BP 113/75 | Temp 97.6°F | Resp 18 | Wt 232.5 lb

## 2016-09-28 DIAGNOSIS — D0511 Intraductal carcinoma in situ of right breast: Secondary | ICD-10-CM | POA: Insufficient documentation

## 2016-09-28 DIAGNOSIS — K589 Irritable bowel syndrome without diarrhea: Secondary | ICD-10-CM | POA: Diagnosis not present

## 2016-09-28 DIAGNOSIS — E119 Type 2 diabetes mellitus without complications: Secondary | ICD-10-CM | POA: Insufficient documentation

## 2016-09-28 DIAGNOSIS — Z8719 Personal history of other diseases of the digestive system: Secondary | ICD-10-CM | POA: Insufficient documentation

## 2016-09-28 DIAGNOSIS — Z923 Personal history of irradiation: Secondary | ICD-10-CM | POA: Diagnosis not present

## 2016-09-28 DIAGNOSIS — I10 Essential (primary) hypertension: Secondary | ICD-10-CM | POA: Diagnosis not present

## 2016-09-28 DIAGNOSIS — K449 Diaphragmatic hernia without obstruction or gangrene: Secondary | ICD-10-CM | POA: Diagnosis not present

## 2016-09-28 DIAGNOSIS — Z170 Estrogen receptor positive status [ER+]: Secondary | ICD-10-CM | POA: Insufficient documentation

## 2016-09-28 DIAGNOSIS — Z9011 Acquired absence of right breast and nipple: Secondary | ICD-10-CM | POA: Diagnosis not present

## 2016-09-28 DIAGNOSIS — N2889 Other specified disorders of kidney and ureter: Secondary | ICD-10-CM | POA: Insufficient documentation

## 2016-09-28 DIAGNOSIS — Z803 Family history of malignant neoplasm of breast: Secondary | ICD-10-CM | POA: Diagnosis not present

## 2016-09-28 DIAGNOSIS — Z7982 Long term (current) use of aspirin: Secondary | ICD-10-CM | POA: Insufficient documentation

## 2016-09-28 DIAGNOSIS — E7800 Pure hypercholesterolemia, unspecified: Secondary | ICD-10-CM | POA: Diagnosis not present

## 2016-09-28 DIAGNOSIS — R002 Palpitations: Secondary | ICD-10-CM | POA: Diagnosis not present

## 2016-09-28 DIAGNOSIS — K769 Liver disease, unspecified: Secondary | ICD-10-CM | POA: Insufficient documentation

## 2016-09-28 DIAGNOSIS — I499 Cardiac arrhythmia, unspecified: Secondary | ICD-10-CM | POA: Diagnosis not present

## 2016-09-28 DIAGNOSIS — Z79810 Long term (current) use of selective estrogen receptor modulators (SERMs): Secondary | ICD-10-CM | POA: Diagnosis not present

## 2016-09-28 DIAGNOSIS — M7989 Other specified soft tissue disorders: Secondary | ICD-10-CM | POA: Insufficient documentation

## 2016-09-28 DIAGNOSIS — K219 Gastro-esophageal reflux disease without esophagitis: Secondary | ICD-10-CM | POA: Diagnosis not present

## 2016-09-28 DIAGNOSIS — Z79899 Other long term (current) drug therapy: Secondary | ICD-10-CM | POA: Insufficient documentation

## 2016-09-28 DIAGNOSIS — K760 Fatty (change of) liver, not elsewhere classified: Secondary | ICD-10-CM | POA: Insufficient documentation

## 2016-09-28 DIAGNOSIS — Z85528 Personal history of other malignant neoplasm of kidney: Secondary | ICD-10-CM | POA: Insufficient documentation

## 2016-09-28 NOTE — Progress Notes (Signed)
Patient is here for follow up, she mentions some right arm swelling but not sure what it is from. She also wanted to ask about her tamoxifen if she still needs to take it or not.

## 2016-10-17 ENCOUNTER — Other Ambulatory Visit: Payer: Self-pay | Admitting: Oncology

## 2016-10-25 DIAGNOSIS — R768 Other specified abnormal immunological findings in serum: Secondary | ICD-10-CM | POA: Diagnosis not present

## 2016-10-25 DIAGNOSIS — M150 Primary generalized (osteo)arthritis: Secondary | ICD-10-CM | POA: Diagnosis not present

## 2016-10-25 DIAGNOSIS — N183 Chronic kidney disease, stage 3 (moderate): Secondary | ICD-10-CM | POA: Diagnosis not present

## 2016-10-28 DIAGNOSIS — N183 Chronic kidney disease, stage 3 (moderate): Secondary | ICD-10-CM | POA: Diagnosis not present

## 2016-10-28 DIAGNOSIS — E1122 Type 2 diabetes mellitus with diabetic chronic kidney disease: Secondary | ICD-10-CM | POA: Diagnosis not present

## 2016-11-04 DIAGNOSIS — I10 Essential (primary) hypertension: Secondary | ICD-10-CM | POA: Diagnosis not present

## 2016-11-04 DIAGNOSIS — K760 Fatty (change of) liver, not elsewhere classified: Secondary | ICD-10-CM | POA: Diagnosis not present

## 2016-11-04 DIAGNOSIS — N183 Chronic kidney disease, stage 3 (moderate): Secondary | ICD-10-CM | POA: Diagnosis not present

## 2016-11-04 DIAGNOSIS — E785 Hyperlipidemia, unspecified: Secondary | ICD-10-CM | POA: Diagnosis not present

## 2016-11-04 DIAGNOSIS — R768 Other specified abnormal immunological findings in serum: Secondary | ICD-10-CM | POA: Diagnosis not present

## 2016-11-04 DIAGNOSIS — E1122 Type 2 diabetes mellitus with diabetic chronic kidney disease: Secondary | ICD-10-CM | POA: Diagnosis not present

## 2016-12-08 DIAGNOSIS — N183 Chronic kidney disease, stage 3 (moderate): Secondary | ICD-10-CM | POA: Diagnosis not present

## 2016-12-08 DIAGNOSIS — I10 Essential (primary) hypertension: Secondary | ICD-10-CM | POA: Diagnosis not present

## 2016-12-08 DIAGNOSIS — N2581 Secondary hyperparathyroidism of renal origin: Secondary | ICD-10-CM | POA: Diagnosis not present

## 2016-12-08 DIAGNOSIS — E119 Type 2 diabetes mellitus without complications: Secondary | ICD-10-CM | POA: Diagnosis not present

## 2016-12-16 DIAGNOSIS — H353131 Nonexudative age-related macular degeneration, bilateral, early dry stage: Secondary | ICD-10-CM | POA: Diagnosis not present

## 2016-12-19 DIAGNOSIS — H353131 Nonexudative age-related macular degeneration, bilateral, early dry stage: Secondary | ICD-10-CM | POA: Diagnosis not present

## 2017-01-13 DIAGNOSIS — Z23 Encounter for immunization: Secondary | ICD-10-CM | POA: Diagnosis not present

## 2017-01-24 DIAGNOSIS — Z1211 Encounter for screening for malignant neoplasm of colon: Secondary | ICD-10-CM | POA: Diagnosis not present

## 2017-01-24 DIAGNOSIS — N816 Rectocele: Secondary | ICD-10-CM | POA: Diagnosis not present

## 2017-01-24 DIAGNOSIS — Z124 Encounter for screening for malignant neoplasm of cervix: Secondary | ICD-10-CM | POA: Diagnosis not present

## 2017-01-24 DIAGNOSIS — Z01411 Encounter for gynecological examination (general) (routine) with abnormal findings: Secondary | ICD-10-CM | POA: Diagnosis not present

## 2017-02-08 DIAGNOSIS — Z1211 Encounter for screening for malignant neoplasm of colon: Secondary | ICD-10-CM | POA: Diagnosis not present

## 2017-03-03 DIAGNOSIS — H35342 Macular cyst, hole, or pseudohole, left eye: Secondary | ICD-10-CM | POA: Diagnosis not present

## 2017-03-31 DIAGNOSIS — D485 Neoplasm of uncertain behavior of skin: Secondary | ICD-10-CM | POA: Diagnosis not present

## 2017-03-31 DIAGNOSIS — L304 Erythema intertrigo: Secondary | ICD-10-CM | POA: Diagnosis not present

## 2017-03-31 DIAGNOSIS — L300 Nummular dermatitis: Secondary | ICD-10-CM | POA: Diagnosis not present

## 2017-04-02 NOTE — Progress Notes (Signed)
[place]  Telephone:(336) [phone] [phone]) [phone]  ID: Gail Mccarthy OB: [birth year]  MR#: [id number]  RWE#:[id number]  Patient Care Team: Leonel Ramsay, MD as PCP - General (Infectious Diseases)  CHIEF COMPLAINT: DCIS of right breast  INTERVAL HISTORY: Patient returns to clinic today for routine six-month follow-up. She continues to tolerate tamoxifen well without significant side effects.  Patient states she found a lump in her right breast at the site of her surgery, but otherwise feels well. She has no neurologic complaints.  She denies any recent fevers or illnesses.  She has a good appetite and denies weight loss.  She denies any chest pain or shortness of breath.  She denies any nausea, vomiting, constipation, or diarrhea.  She has no urinary complaints.  Patient offers no further specific complaints today.   REVIEW OF SYSTEMS:   Review of Systems  Constitutional: Negative.  Negative for fever, malaise/fatigue and weight loss.  Respiratory: Negative.  Negative for cough and shortness of breath.   Cardiovascular: Negative.  Negative for chest pain and leg swelling.  Gastrointestinal: Negative.  Negative for abdominal pain.  Genitourinary: Negative.   Musculoskeletal: Negative.  Negative for myalgias.  Skin: Negative.  Negative for rash.  Neurological: Negative.  Negative for sensory change, focal weakness and weakness.  Psychiatric/Behavioral: The patient is nervous/anxious.     As per HPI. Otherwise, a complete review of systems is negative.  PAST MEDICAL HISTORY: Past Medical History:  Diagnosis Date  . Breast cancer ([place]) 2014   RT LUMPECTOMY  . Cough    CHRONIC  . Diabetes mellitus without complication ([place])   . Diverticulosis   . Dizziness    MEDICINE RELATED  . Dysrhythmia   . GERD (gastroesophageal reflux disease)   . Heart palpitations   . Hepatic steatosis   . High cholesterol   . History of hiatal hernia   . History of  partial mastectomy of right breast   . Hypertension   . IBS (irritable bowel syndrome)   . Liver mass   . Neoplasm of kidney   . Personal history of radiation therapy 2014   BREAST CA  . Radiation 2014   BREAST CA  . Renal cell carcinoma ([place]) 5005  . Renal insufficiency     PAST SURGICAL HISTORY: Past Surgical History:  Procedure Laterality Date  . APPENDECTOMY    . BREAST BIOPSY Right 2014   POS  . BREAST LUMPECTOMY Right 2014   BREAST CA  . CATARACT EXTRACTION W/PHACO Right 10/27/2014   Procedure: CATARACT EXTRACTION PHACO AND INTRAOCULAR LENS PLACEMENT (IOC);  Surgeon: Estill Cotta, MD;  Location: [facility] ORS;  Service: Ophthalmology;  Laterality: Right;  [place]: 01:30.0 AP%: 25.7 CDE: 40.11 Fluid lot# [id number]  . CHOLECYSTECTOMY    . COLONOSCOPY WITH PROPOFOL N/A 10/08/2014   Procedure: COLONOSCOPY WITH PROPOFOL;  Surgeon: Manya Silvas, MD;  Location: [facility] ENDOSCOPY;  Service: Endoscopy;  Laterality: N/A;  . ESOPHAGOGASTRODUODENOSCOPY (EGD) WITH PROPOFOL N/A 10/08/2014   Procedure: ESOPHAGOGASTRODUODENOSCOPY (EGD) WITH PROPOFOL;  Surgeon: Manya Silvas, MD;  Location: [facility] ENDOSCOPY;  Service: Endoscopy;  Laterality: N/A;  . EYE SURGERY     macular hole repair  . LEG SURGERY Right    FX PINS,SCREWS  . NEPHRECTOMT Right   . TONSILLECTOMY      FAMILY HISTORY Family History  Problem Relation Age of Onset  . Diabetes Other   . COPD Other   . Colitis Other   . Breast cancer Paternal Aunt  ADVANCED DIRECTIVES:    HEALTH MAINTENANCE: Social History   Tobacco Use  . Smoking status: Never Smoker  . Smokeless tobacco: Never Used  Substance Use Topics  . Alcohol use: No  . Drug use: Not on file     Colonoscopy:  PAP:  Bone density:  Lipid panel:  Allergies  Allergen Reactions  . Cefdinir Nausea And Vomiting  . Codeine Nausea Only  . Gabapentin Nausea Only and Nausea And Vomiting    Current Outpatient Medications  Medication Sig Dispense  Refill  . acetaminophen (TYLENOL) 500 MG tablet Take 1,000 mg by mouth every 6 (six) hours as needed.    Marland Kitchen aspirin 81 MG tablet Take 81 mg by mouth daily.    . benazepril (LOTENSIN) 10 MG tablet Take 10 mg by mouth daily.    . calcitRIOL (ROCALTROL) 0.25 MCG capsule Take 0.25 mcg by mouth daily.    . Cholecalciferol (VITAMIN D) 2000 units tablet Take 2,000 Units by mouth daily.    . hydroxychloroquine (PLAQUENIL) 200 MG tablet Take 400 mg by mouth daily.     . Multiple Vitamins-Minerals (PRESERVISION AREDS) TABS Take 60 mg by mouth daily.    . pantoprazole (PROTONIX) 40 MG tablet Take 40 mg by mouth daily.    . sucralfate (CARAFATE) 1 G tablet Take 1 g by mouth 4 (four) times daily -  with meals and at bedtime.    . tamoxifen (NOLVADEX) 20 MG tablet Take 1 tablet (20 mg total) by mouth daily. 90 tablet 0  . triamterene-hydrochlorothiazide (MAXZIDE-25) 37.5-25 MG per tablet Take 1 tablet by mouth daily.    . traMADol (ULTRAM) 50 MG tablet Take by mouth.     No current facility-administered medications for this visit.     OBJECTIVE: Vitals:   04/05/17 1354  BP: 118/76  Pulse: 79  Resp: 18  Temp: 97.7 F (36.5 C)     Body mass index is 35.41 kg/m.    ECOG FS:0 - Asymptomatic  General: Well-developed, well-nourished, no acute distress. Eyes: anicteric sclera. Breasts: Palpable lump on right breast at the site of previous surgery.  Left breast and axilla without lumps or masses. Lungs: Clear to auscultation bilaterally. Heart: Regular rate and rhythm. No rubs, murmurs, or gallops. Abdomen: Soft, nontender, nondistended. No organomegaly noted, normoactive bowel sounds. Musculoskeletal: No edema, cyanosis, or clubbing. Neuro: Alert, answering all questions appropriately. Cranial nerves grossly intact. Skin: No rashes or petechiae noted. Psych: Normal affect.  LAB RESULTS:  Lab Results  Component Value Date   K 4.1 01/10/2014    Lab Results  Component Value Date   WBC 7.1  03/16/2016   NEUTROABS 4.9 03/16/2016   HGB 13.4 03/16/2016   HCT 40.2 03/16/2016   MCV 82.7 03/16/2016   PLT 154 03/16/2016     STUDIES: No results found.  ASSESSMENT: DCIS, right breast.  PLAN:    1.  DCIS of right breast: No evidence of disease.  Continue 5 years of tamoxifen completing in August 2019. Patient most recent right breast mammogram on Aug 01, 2016 was reported as BI-RADS 2.  Because of the new palpable lump in her right breast, will move up her next mammogram to early February.  If her mammogram is suspicious, she will return to clinic 1-2 weeks later for further evaluation.  If mammogram is negative, return to clinic in 6 months as scheduled.   2. Hypertension: Patient's blood pressure is within normal limits today. Continue current medications as prescribed.  Approximately 30 minutes  was spent in discussion of which greater than 50% was consultation.   Patient expressed understanding and was in agreement with this plan. She also understands that She can call clinic at any time with any questions, concerns, or complaints.   DCIS (ductal carcinoma in situ)   Staging form: Breast, AJCC 7th Edition     Clinical stage from 09/21/2014: Stage 0 (Tis (DCIS), N0, M0) - Signed by Lloyd Huger, MD on 09/21/2014   Lloyd Huger, MD   04/09/2017 7:47 AM

## 2017-04-03 DIAGNOSIS — L821 Other seborrheic keratosis: Secondary | ICD-10-CM | POA: Diagnosis not present

## 2017-04-04 DIAGNOSIS — H353131 Nonexudative age-related macular degeneration, bilateral, early dry stage: Secondary | ICD-10-CM | POA: Diagnosis not present

## 2017-04-04 DIAGNOSIS — H35342 Macular cyst, hole, or pseudohole, left eye: Secondary | ICD-10-CM | POA: Diagnosis not present

## 2017-04-05 ENCOUNTER — Inpatient Hospital Stay: Payer: Medicare Other | Attending: Oncology | Admitting: Oncology

## 2017-04-05 VITALS — BP 118/76 | HR 79 | Temp 97.7°F | Resp 18 | Wt 212.8 lb

## 2017-04-05 DIAGNOSIS — Z923 Personal history of irradiation: Secondary | ICD-10-CM

## 2017-04-05 DIAGNOSIS — Z803 Family history of malignant neoplasm of breast: Secondary | ICD-10-CM

## 2017-04-05 DIAGNOSIS — D0511 Intraductal carcinoma in situ of right breast: Secondary | ICD-10-CM

## 2017-04-05 DIAGNOSIS — Z7982 Long term (current) use of aspirin: Secondary | ICD-10-CM

## 2017-04-05 DIAGNOSIS — Z79811 Long term (current) use of aromatase inhibitors: Secondary | ICD-10-CM

## 2017-04-05 DIAGNOSIS — Z85528 Personal history of other malignant neoplasm of kidney: Secondary | ICD-10-CM | POA: Diagnosis not present

## 2017-04-05 DIAGNOSIS — Z79899 Other long term (current) drug therapy: Secondary | ICD-10-CM | POA: Diagnosis not present

## 2017-04-05 NOTE — Progress Notes (Signed)
Patient is here today for follow up, she mentions she has found a lump in her right breast where she had partial mastectomy and is about 1.5in long. She noted this back in mid December.

## 2017-04-20 DIAGNOSIS — N183 Chronic kidney disease, stage 3 (moderate): Secondary | ICD-10-CM | POA: Diagnosis not present

## 2017-04-20 DIAGNOSIS — N2581 Secondary hyperparathyroidism of renal origin: Secondary | ICD-10-CM | POA: Diagnosis not present

## 2017-04-20 DIAGNOSIS — I10 Essential (primary) hypertension: Secondary | ICD-10-CM | POA: Diagnosis not present

## 2017-04-20 DIAGNOSIS — E119 Type 2 diabetes mellitus without complications: Secondary | ICD-10-CM | POA: Diagnosis not present

## 2017-04-25 ENCOUNTER — Ambulatory Visit
Admission: RE | Admit: 2017-04-25 | Discharge: 2017-04-25 | Disposition: A | Discharge status: Home / Self Care | Payer: Medicare Other | Source: Ambulatory Visit | Attending: Oncology | Admitting: Oncology

## 2017-04-25 DIAGNOSIS — D0511 Intraductal carcinoma in situ of right breast: Secondary | ICD-10-CM

## 2017-04-25 DIAGNOSIS — R928 Other abnormal and inconclusive findings on diagnostic imaging of breast: Secondary | ICD-10-CM | POA: Diagnosis not present

## 2017-04-25 DIAGNOSIS — N6314 Unspecified lump in the right breast, lower inner quadrant: Secondary | ICD-10-CM | POA: Diagnosis not present

## 2017-04-25 DIAGNOSIS — Z853 Personal history of malignant neoplasm of breast: Secondary | ICD-10-CM | POA: Insufficient documentation

## 2017-04-25 DIAGNOSIS — N6489 Other specified disorders of breast: Secondary | ICD-10-CM | POA: Diagnosis not present

## 2017-04-26 DIAGNOSIS — H35342 Macular cyst, hole, or pseudohole, left eye: Secondary | ICD-10-CM | POA: Diagnosis not present

## 2017-04-26 DIAGNOSIS — K219 Gastro-esophageal reflux disease without esophagitis: Secondary | ICD-10-CM | POA: Diagnosis not present

## 2017-04-26 DIAGNOSIS — I10 Essential (primary) hypertension: Secondary | ICD-10-CM | POA: Diagnosis not present

## 2017-04-27 DIAGNOSIS — H35342 Macular cyst, hole, or pseudohole, left eye: Secondary | ICD-10-CM | POA: Insufficient documentation

## 2017-05-08 DIAGNOSIS — H35342 Macular cyst, hole, or pseudohole, left eye: Secondary | ICD-10-CM | POA: Diagnosis not present

## 2017-05-25 ENCOUNTER — Encounter: Payer: Self-pay | Admitting: Radiation Oncology

## 2017-05-25 ENCOUNTER — Ambulatory Visit
Admission: RE | Admit: 2017-05-25 | Discharge: 2017-05-25 | Disposition: A | Discharge status: Home / Self Care | Payer: Medicare Other | Source: Ambulatory Visit | Attending: Radiation Oncology | Admitting: Radiation Oncology

## 2017-05-25 ENCOUNTER — Other Ambulatory Visit: Payer: Self-pay

## 2017-05-25 VITALS — BP 142/74 | HR 56 | Temp 96.9°F | Resp 20 | Wt 216.9 lb

## 2017-05-25 DIAGNOSIS — Z79899 Other long term (current) drug therapy: Secondary | ICD-10-CM | POA: Diagnosis not present

## 2017-05-25 DIAGNOSIS — Z853 Personal history of malignant neoplasm of breast: Secondary | ICD-10-CM | POA: Diagnosis not present

## 2017-05-25 DIAGNOSIS — D0511 Intraductal carcinoma in situ of right breast: Secondary | ICD-10-CM

## 2017-05-25 DIAGNOSIS — Z170 Estrogen receptor positive status [ER+]: Secondary | ICD-10-CM | POA: Diagnosis not present

## 2017-05-25 NOTE — Progress Notes (Signed)
Radiation Oncology Follow up Note  Name: Gail Mccarthy   Date:   05/25/2017 MRN:  [id number] DOB: 1940/09/02    This 77 y.o. 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 She seen today in routine follow-up and is doing well. She specifically denies breast tenderness cough or bone pain.. She had a mammogram of her right breast in February which was BI-RADS 2 benign which I have reviewed. She is currently on tamoxifen tolerating that well without side effect.  COMPLICATIONS OF TREATMENT: none  FOLLOW UP COMPLIANCE: keeps appointments   PHYSICAL EXAM:  BP (!) 142/74   Pulse (!) 56   Temp (!) 96.9 F (36.1 C)   Resp 20   Wt 216 lb 14.9 oz (98.4 kg)   BMI 36.10 kg/m  Lungs are clear to A&P cardiac examination essentially unremarkable with regular rate and rhythm. No dominant mass or nodularity is noted in either breast in 2 positions examined. Incision is well-healed. No axillary or supraclavicular adenopathy is appreciated. Cosmetic result is excellent. Well-developed well-nourished patient in NAD. HEENT reveals PERLA, EOMI, discs not visualized.  Oral cavity is clear. No oral mucosal lesions are identified. Neck is clear without evidence of cervical or supraclavicular adenopathy. Lungs are clear to A&P. Cardiac examination is essentially unremarkable with regular rate and rhythm without murmur rub or thrill. Abdomen is benign with no organomegaly or masses noted. Motor sensory and DTR levels are equal and symmetric in the upper and lower extremities. Cranial nerves II through XII are grossly intact. Proprioception is intact. No peripheral adenopathy or edema is identified. No motor or  sensory levels are noted. Crude visual fields are within normal range.  RADIOLOGY RESULTS: Right breast mammogram reviewed and compatible with the above-stated findings  PLAN: Present time patient is doing well with no evidence of disease close to 5 years out. I am going to discontinue follow-up care. Patient will continue follow-up care with medical oncology. She continues on tamoxifen. Patient knows to call at anytime with any concerns.  I would like to take this opportunity to thank you for allowing me to participate in the care of your patient.Noreene Filbert, MD

## 2017-06-05 DIAGNOSIS — H35342 Macular cyst, hole, or pseudohole, left eye: Secondary | ICD-10-CM | POA: Diagnosis not present

## 2017-06-06 DIAGNOSIS — R600 Localized edema: Secondary | ICD-10-CM | POA: Diagnosis not present

## 2017-06-06 DIAGNOSIS — R768 Other specified abnormal immunological findings in serum: Secondary | ICD-10-CM | POA: Diagnosis not present

## 2017-06-15 ENCOUNTER — Other Ambulatory Visit: Payer: Self-pay | Admitting: Oncology

## 2017-06-15 ENCOUNTER — Other Ambulatory Visit: Payer: Self-pay | Admitting: *Deleted

## 2017-06-15 DIAGNOSIS — D0511 Intraductal carcinoma in situ of right breast: Secondary | ICD-10-CM

## 2017-06-20 DIAGNOSIS — N183 Chronic kidney disease, stage 3 (moderate): Secondary | ICD-10-CM | POA: Diagnosis not present

## 2017-06-20 DIAGNOSIS — E1122 Type 2 diabetes mellitus with diabetic chronic kidney disease: Secondary | ICD-10-CM | POA: Diagnosis not present

## 2017-06-20 DIAGNOSIS — K1121 Acute sialoadenitis: Secondary | ICD-10-CM | POA: Diagnosis not present

## 2017-06-28 DIAGNOSIS — R768 Other specified abnormal immunological findings in serum: Secondary | ICD-10-CM | POA: Diagnosis not present

## 2017-06-28 DIAGNOSIS — N183 Chronic kidney disease, stage 3 (moderate): Secondary | ICD-10-CM | POA: Diagnosis not present

## 2017-06-28 DIAGNOSIS — Z853 Personal history of malignant neoplasm of breast: Secondary | ICD-10-CM | POA: Diagnosis not present

## 2017-06-28 DIAGNOSIS — I10 Essential (primary) hypertension: Secondary | ICD-10-CM | POA: Diagnosis not present

## 2017-06-28 DIAGNOSIS — E785 Hyperlipidemia, unspecified: Secondary | ICD-10-CM | POA: Diagnosis not present

## 2017-06-28 DIAGNOSIS — Z0000 Encounter for general adult medical examination without abnormal findings: Secondary | ICD-10-CM | POA: Diagnosis not present

## 2017-07-06 DIAGNOSIS — K115 Sialolithiasis: Secondary | ICD-10-CM | POA: Diagnosis not present

## 2017-07-06 DIAGNOSIS — K1121 Acute sialoadenitis: Secondary | ICD-10-CM | POA: Diagnosis not present

## 2017-07-11 ENCOUNTER — Other Ambulatory Visit: Payer: Medicare Other

## 2017-07-17 ENCOUNTER — Other Ambulatory Visit: Payer: Self-pay | Admitting: Oncology

## 2017-08-04 ENCOUNTER — Ambulatory Visit
Admission: RE | Admit: 2017-08-04 | Discharge: 2017-08-04 | Disposition: A | Discharge status: Home / Self Care | Payer: Medicare Other | Source: Ambulatory Visit | Attending: Oncology | Admitting: Oncology

## 2017-08-04 DIAGNOSIS — R928 Other abnormal and inconclusive findings on diagnostic imaging of breast: Secondary | ICD-10-CM | POA: Diagnosis not present

## 2017-08-04 DIAGNOSIS — D0511 Intraductal carcinoma in situ of right breast: Secondary | ICD-10-CM | POA: Insufficient documentation

## 2017-08-07 DIAGNOSIS — H35342 Macular cyst, hole, or pseudohole, left eye: Secondary | ICD-10-CM | POA: Diagnosis not present

## 2017-08-25 DIAGNOSIS — I10 Essential (primary) hypertension: Secondary | ICD-10-CM | POA: Diagnosis not present

## 2017-08-25 DIAGNOSIS — N2581 Secondary hyperparathyroidism of renal origin: Secondary | ICD-10-CM | POA: Diagnosis not present

## 2017-08-25 DIAGNOSIS — E119 Type 2 diabetes mellitus without complications: Secondary | ICD-10-CM | POA: Diagnosis not present

## 2017-08-25 DIAGNOSIS — N183 Chronic kidney disease, stage 3 (moderate): Secondary | ICD-10-CM | POA: Diagnosis not present

## 2017-10-09 DIAGNOSIS — H2512 Age-related nuclear cataract, left eye: Secondary | ICD-10-CM | POA: Diagnosis not present

## 2017-10-09 NOTE — Progress Notes (Signed)
[place]  Telephone:([phone] Fax:(336) [phone]  ID: [id number] HUTT OB: 1940-06-27  MR#: [id number]  OIB#:[id number]  Patient Care Team: Baxter Hire, MD as PCP - General (Internal Medicine)  CHIEF COMPLAINT: DCIS of right breast  INTERVAL HISTORY: Patient returns to clinic today for routine six-month follow-up.  She currently feels well and is asymptomatic.  She continues to tolerate tamoxifen without significant side effects. She has no neurologic complaints.  She denies any recent fevers or illnesses.  She has a good appetite and denies weight loss.  She denies any chest pain or shortness of breath.  She denies any nausea, vomiting, constipation, or diarrhea.  She has no urinary complaints.  Patient feels at her baseline offers no specific complaints today.  REVIEW OF SYSTEMS:   Review of Systems  Constitutional: Negative.  Negative for fever, malaise/fatigue and weight loss.  Respiratory: Negative.  Negative for cough and shortness of breath.   Cardiovascular: Negative.  Negative for chest pain and leg swelling.  Gastrointestinal: Negative.  Negative for abdominal pain.  Genitourinary: Negative.  Negative for dysuria.  Musculoskeletal: Negative.  Negative for myalgias.  Skin: Negative.  Negative for rash.  Neurological: Negative.  Negative for sensory change, focal weakness, weakness and headaches.  Psychiatric/Behavioral: Negative.  The patient is not nervous/anxious.     As per HPI. Otherwise, a complete review of systems is negative.  PAST MEDICAL HISTORY: Past Medical History:  Diagnosis Date  . Breast cancer ([place]) 2014   RT LUMPECTOMY  . Cough    CHRONIC  . Diabetes mellitus without complication ([place])   . Diverticulosis   . Dizziness    MEDICINE RELATED  . Dysrhythmia   . GERD (gastroesophageal reflux disease)   . Heart palpitations   . Hepatic steatosis   . High cholesterol   . History of hiatal hernia   . History of partial  mastectomy of right breast   . Hypertension   . IBS (irritable bowel syndrome)   . Liver mass   . Neoplasm of kidney   . Personal history of radiation therapy 2014   BREAST CA  . Radiation 2014   BREAST CA  . Renal cell carcinoma ([place]) 5005  . Renal insufficiency     PAST SURGICAL HISTORY: Past Surgical History:  Procedure Laterality Date  . APPENDECTOMY    . BREAST EXCISIONAL BIOPSY Right 08/20/2012   hx of breast ca neg but close .54mm margins radation no chemo   . BREAST LUMPECTOMY Right 2014   BREAST CA  . CATARACT EXTRACTION W/PHACO Right 10/27/2014   Procedure: CATARACT EXTRACTION PHACO AND INTRAOCULAR LENS PLACEMENT (IOC);  Surgeon: Estill Cotta, MD;  Location: [facility] ORS;  Service: Ophthalmology;  Laterality: Right;  [place]: 01:30.0 AP%: 25.7 CDE: 40.11 Fluid lot# [id number]  . CHOLECYSTECTOMY    . COLONOSCOPY WITH PROPOFOL N/A 10/08/2014   Procedure: COLONOSCOPY WITH PROPOFOL;  Surgeon: Manya Silvas, MD;  Location: [facility] ENDOSCOPY;  Service: Endoscopy;  Laterality: N/A;  . ESOPHAGOGASTRODUODENOSCOPY (EGD) WITH PROPOFOL N/A 10/08/2014   Procedure: ESOPHAGOGASTRODUODENOSCOPY (EGD) WITH PROPOFOL;  Surgeon: Manya Silvas, MD;  Location: [facility] ENDOSCOPY;  Service: Endoscopy;  Laterality: N/A;  . EYE SURGERY     macular hole repair  . LEG SURGERY Right    FX PINS,SCREWS  . NEPHRECTOMT Right   . TONSILLECTOMY      FAMILY HISTORY Family History  Problem Relation Age of Onset  . Diabetes Other   . COPD Other   . Colitis  Other   . Breast cancer Paternal Aunt        ADVANCED DIRECTIVES:    HEALTH MAINTENANCE: Social History   Tobacco Use  . Smoking status: Never Smoker  . Smokeless tobacco: Never Used  Substance Use Topics  . Alcohol use: No  . Drug use: Not on file     Colonoscopy:  PAP:  Bone density:  Lipid panel:  Allergies  Allergen Reactions  . Amoxicillin-Pot Clavulanate Nausea Only  . Cefdinir Nausea And Vomiting  . Codeine Nausea Only  .  Gabapentin Nausea Only and Nausea And Vomiting  . Clindamycin Nausea And Vomiting    Current Outpatient Medications  Medication Sig Dispense Refill  . amLODipine (NORVASC) 2.5 MG tablet   5  . aspirin 81 MG tablet Take 81 mg by mouth daily.    . benazepril (LOTENSIN) 10 MG tablet Take 10 mg by mouth daily.    . calcitRIOL (ROCALTROL) 0.25 MCG capsule Take 0.25 mcg by mouth daily.    . Cholecalciferol (VITAMIN D) 2000 units tablet Take 2,000 Units by mouth daily.    . hydroxychloroquine (PLAQUENIL) 200 MG tablet Take 200 mg by mouth daily.     . Multiple Vitamins-Minerals (PRESERVISION AREDS) TABS Take 60 mg by mouth daily.    . pantoprazole (PROTONIX) 40 MG tablet Take 40 mg by mouth daily.    . sucralfate (CARAFATE) 1 G tablet Take 1 g by mouth 4 (four) times daily -  with meals and at bedtime.    . tamoxifen (NOLVADEX) 20 MG tablet Take 1 tablet (20 mg total) by mouth daily. 90 tablet 0  . acetaminophen (TYLENOL) 500 MG tablet Take 1,000 mg by mouth every 6 (six) hours as needed.    . traMADol (ULTRAM) 50 MG tablet Take by mouth.    . triamterene-hydrochlorothiazide (MAXZIDE-25) 37.5-25 MG per tablet Take 1 tablet by mouth daily.     No current facility-administered medications for this visit.     OBJECTIVE: Vitals:   10/11/17 1400  BP: (!) 145/83  Pulse: 67  Resp: 18  Temp: 98.4 F (36.9 C)     Body mass index is 36.78 kg/m.    ECOG FS:0 - Asymptomatic  General: Well-developed, well-nourished, no acute distress. Eyes: Pink conjunctiva, anicteric sclera. HEENT: Normocephalic, moist mucous membranes, clear oropharnyx. Breast: Bilateral breast and axilla without lumps or masses. Lungs: Clear to auscultation bilaterally. Heart: Regular rate and rhythm. No rubs, murmurs, or gallops. Abdomen: Soft, nontender, nondistended. No organomegaly noted, normoactive bowel sounds. Musculoskeletal: No edema, cyanosis, or clubbing. Neuro: Alert, answering all questions appropriately.  Cranial nerves grossly intact. Skin: No rashes or petechiae noted. Psych: Normal affect.  LAB RESULTS:  Lab Results  Component Value Date   K 4.1 01/10/2014    Lab Results  Component Value Date   WBC 7.1 03/16/2016   NEUTROABS 4.9 03/16/2016   HGB 13.4 03/16/2016   HCT 40.2 03/16/2016   MCV 82.7 03/16/2016   PLT 154 03/16/2016     STUDIES: No results found.  ASSESSMENT: DCIS, right breast.  PLAN:    1.  DCIS of right breast: No evidence of disease.  Patient will complete her 5 years of tamoxifen in the next month.  Her most recent mammogram on Aug 04, 2017 was reported as BI-RADS 2.  Repeat in May 2020.  Patient will return to clinic in 1 year for routine evaluation.  If everything remains stable, she likely can be discharged from clinic.  I spent a total  of 20 minutes face-to-face with the patient of which greater than 50% of the visit was spent in counseling and coordination of care as detailed above.   Patient expressed understanding and was in agreement with this plan. She also understands that She can call clinic at any time with any questions, concerns, or complaints.   DCIS (ductal carcinoma in situ)   Staging form: Breast, AJCC 7th Edition     Clinical stage from 09/21/2014: Stage 0 (Tis (DCIS), N0, M0) - Signed by Lloyd Huger, MD on 09/21/2014   Lloyd Huger, MD   10/13/2017 2:55 PM

## 2017-10-11 ENCOUNTER — Inpatient Hospital Stay: Payer: Medicare Other | Attending: Oncology | Admitting: Oncology

## 2017-10-11 ENCOUNTER — Other Ambulatory Visit: Payer: Self-pay

## 2017-10-11 VITALS — BP 145/83 | HR 67 | Temp 98.4°F | Resp 18 | Wt 221.0 lb

## 2017-10-11 DIAGNOSIS — Z803 Family history of malignant neoplasm of breast: Secondary | ICD-10-CM | POA: Insufficient documentation

## 2017-10-11 DIAGNOSIS — Z85528 Personal history of other malignant neoplasm of kidney: Secondary | ICD-10-CM | POA: Insufficient documentation

## 2017-10-11 DIAGNOSIS — Z7982 Long term (current) use of aspirin: Secondary | ICD-10-CM | POA: Diagnosis not present

## 2017-10-11 DIAGNOSIS — Z79899 Other long term (current) drug therapy: Secondary | ICD-10-CM

## 2017-10-11 DIAGNOSIS — Z923 Personal history of irradiation: Secondary | ICD-10-CM

## 2017-10-11 DIAGNOSIS — I10 Essential (primary) hypertension: Secondary | ICD-10-CM | POA: Insufficient documentation

## 2017-10-11 DIAGNOSIS — D0511 Intraductal carcinoma in situ of right breast: Secondary | ICD-10-CM | POA: Diagnosis not present

## 2017-10-11 DIAGNOSIS — E119 Type 2 diabetes mellitus without complications: Secondary | ICD-10-CM | POA: Diagnosis not present

## 2017-10-11 DIAGNOSIS — Z79811 Long term (current) use of aromatase inhibitors: Secondary | ICD-10-CM | POA: Insufficient documentation

## 2017-10-11 NOTE — Progress Notes (Signed)
Here for follow up. Overall stated " doing good" gowned.

## 2017-10-19 ENCOUNTER — Other Ambulatory Visit: Payer: Self-pay

## 2017-10-19 ENCOUNTER — Encounter: Payer: Self-pay | Admitting: *Deleted

## 2017-10-19 DIAGNOSIS — H2512 Age-related nuclear cataract, left eye: Secondary | ICD-10-CM | POA: Diagnosis not present

## 2017-10-27 NOTE — Discharge Instructions (Signed)

## 2017-10-31 ENCOUNTER — Ambulatory Visit
Admission: RE | Admit: 2017-10-31 | Discharge: 2017-10-31 | Disposition: A | Discharge status: Home / Self Care | Payer: Medicare Other | Source: Ambulatory Visit | Attending: Ophthalmology | Admitting: Ophthalmology

## 2017-10-31 ENCOUNTER — Ambulatory Visit: Payer: Medicare Other | Admitting: Anesthesiology

## 2017-10-31 ENCOUNTER — Encounter
Admission: RE | Disposition: A | Discharge status: Home / Self Care | Payer: Self-pay | Source: Ambulatory Visit | Attending: Ophthalmology

## 2017-10-31 DIAGNOSIS — K589 Irritable bowel syndrome without diarrhea: Secondary | ICD-10-CM | POA: Diagnosis not present

## 2017-10-31 DIAGNOSIS — H25812 Combined forms of age-related cataract, left eye: Secondary | ICD-10-CM | POA: Diagnosis not present

## 2017-10-31 DIAGNOSIS — Z853 Personal history of malignant neoplasm of breast: Secondary | ICD-10-CM | POA: Diagnosis not present

## 2017-10-31 DIAGNOSIS — Z85528 Personal history of other malignant neoplasm of kidney: Secondary | ICD-10-CM | POA: Insufficient documentation

## 2017-10-31 DIAGNOSIS — I10 Essential (primary) hypertension: Secondary | ICD-10-CM | POA: Insufficient documentation

## 2017-10-31 DIAGNOSIS — Z79899 Other long term (current) drug therapy: Secondary | ICD-10-CM | POA: Diagnosis not present

## 2017-10-31 DIAGNOSIS — K219 Gastro-esophageal reflux disease without esophagitis: Secondary | ICD-10-CM | POA: Diagnosis not present

## 2017-10-31 DIAGNOSIS — Z7982 Long term (current) use of aspirin: Secondary | ICD-10-CM | POA: Insufficient documentation

## 2017-10-31 DIAGNOSIS — H2512 Age-related nuclear cataract, left eye: Secondary | ICD-10-CM | POA: Diagnosis not present

## 2017-10-31 HISTORY — DX: Motion sickness, initial encounter: T75.3XXA

## 2017-10-31 HISTORY — DX: Disorder of parathyroid gland, unspecified: E21.5

## 2017-10-31 HISTORY — PX: CATARACT EXTRACTION W/PHACO: SHX586

## 2017-10-31 SURGERY — PHACOEMULSIFICATION, CATARACT, WITH IOL INSERTION
Anesthesia: Monitor Anesthesia Care | Site: Eye | Laterality: Left | Wound class: "Clean "

## 2017-10-31 MED ORDER — FENTANYL CITRATE (PF) 100 MCG/2ML IJ SOLN
INTRAMUSCULAR | Status: DC | PRN
  Administered 2017-10-31 (×2): 50 ug via INTRAVENOUS

## 2017-10-31 MED ORDER — MOXIFLOXACIN HCL 0.5 % OP SOLN
1.0000 [drp] | OPHTHALMIC | Status: DC | PRN
  Administered 2017-10-31 (×3): 1 [drp] via OPHTHALMIC

## 2017-10-31 MED ORDER — MIDAZOLAM HCL 2 MG/2ML IJ SOLN
INTRAMUSCULAR | Status: DC | PRN
  Administered 2017-10-31 (×2): 1 mg via INTRAVENOUS

## 2017-10-31 MED ORDER — CEFUROXIME OPHTHALMIC INJECTION 1 MG/0.1 ML
INJECTION | OPHTHALMIC | Status: DC | PRN
  Administered 2017-10-31: 0.1 mL via OPHTHALMIC

## 2017-10-31 MED ORDER — NA HYALUR & NA CHOND-NA HYALUR 0.4-0.35 ML IO KIT
PACK | INTRAOCULAR | Status: DC | PRN
  Administered 2017-10-31: 1 mL via INTRAOCULAR

## 2017-10-31 MED ORDER — LACTATED RINGERS IV SOLN: INTRAVENOUS | Status: DC

## 2017-10-31 MED ORDER — LIDOCAINE HCL (PF) 2 % IJ SOLN
INTRAMUSCULAR | Status: DC | PRN
  Administered 2017-10-31: 1 mL

## 2017-10-31 MED ORDER — ARMC OPHTHALMIC DILATING DROPS
1.0000 "application " | OPHTHALMIC | Status: DC | PRN
  Administered 2017-10-31 (×3): 1 via OPHTHALMIC

## 2017-10-31 MED ORDER — EPINEPHRINE PF 1 MG/ML IJ SOLN
INTRAOCULAR | Status: DC | PRN
  Administered 2017-10-31: 58 mL via OPHTHALMIC

## 2017-10-31 MED ORDER — BRIMONIDINE TARTRATE-TIMOLOL 0.2-0.5 % OP SOLN
OPHTHALMIC | Status: DC | PRN
  Administered 2017-10-31: 1 [drp] via OPHTHALMIC

## 2017-10-31 SURGICAL SUPPLY — 27 items
CANNULA ANT/CHMB 27G (MISCELLANEOUS) ×1 IMPLANT
CANNULA ANT/CHMB 27GA (MISCELLANEOUS) ×3 IMPLANT
CARTRIDGE ABBOTT (MISCELLANEOUS) IMPLANT
GLOVE SURG LX 7.5 STRW (GLOVE) ×2
GLOVE SURG LX STRL 7.5 STRW (GLOVE) ×1 IMPLANT
GLOVE SURG TRIUMPH 8.0 PF LTX (GLOVE) ×3 IMPLANT
GOWN STRL REUS W/ TWL LRG LVL3 (GOWN DISPOSABLE) ×2 IMPLANT
GOWN STRL REUS W/TWL LRG LVL3 (GOWN DISPOSABLE) ×4
LENS IOL ACRYSOF IQ 21.5 (Intraocular Lens) ×2 IMPLANT
MARKER SKIN DUAL TIP RULER LAB (MISCELLANEOUS) ×3 IMPLANT
NDL FILTER BLUNT 18X1 1/2 (NEEDLE) ×1 IMPLANT
NDL RETROBULBAR .5 NSTRL (NEEDLE) IMPLANT
NEEDLE FILTER BLUNT 18X 1/2SAF (NEEDLE) ×2
NEEDLE FILTER BLUNT 18X1 1/2 (NEEDLE) ×1 IMPLANT
PACK CATARACT BRASINGTON (MISCELLANEOUS) ×3 IMPLANT
PACK EYE AFTER SURG (MISCELLANEOUS) ×3 IMPLANT
PACK OPTHALMIC (MISCELLANEOUS) ×3 IMPLANT
RING MALYGIN 7.0 (MISCELLANEOUS) IMPLANT
SUT ETHILON 10-0 CS-B-6CS-B-6 (SUTURE)
SUT VICRYL  9 0 (SUTURE)
SUT VICRYL 9 0 (SUTURE) IMPLANT
SUTURE EHLN 10-0 CS-B-6CS-B-6 (SUTURE) IMPLANT
SYR 3ML LL SCALE MARK (SYRINGE) ×3 IMPLANT
SYR 5ML LL (SYRINGE) ×3 IMPLANT
SYR TB 1ML LUER SLIP (SYRINGE) ×3 IMPLANT
WATER STERILE IRR 500ML POUR (IV SOLUTION) ×3 IMPLANT
WIPE NON LINTING 3.25X3.25 (MISCELLANEOUS) ×3 IMPLANT

## 2017-10-31 NOTE — Anesthesia Procedure Notes (Signed)
Procedure Name: MAC Date/Time: 10/31/2017 8:18 AM Performed by: Cameron Ali, CRNA Pre-anesthesia Checklist: Patient identified, Emergency Drugs available, Suction available, Timeout performed and Patient being monitored Patient Re-evaluated:Patient Re-evaluated prior to induction Oxygen Delivery Method: Nasal cannula Placement Confirmation: positive ETCO2

## 2017-10-31 NOTE — Anesthesia Preprocedure Evaluation (Signed)
Anesthesia Evaluation    Airway Mallampati: II  TM Distance: >3 FB Neck ROM: Full    Dental no notable dental hx.    Pulmonary neg pulmonary ROS,    Pulmonary exam normal breath sounds clear to auscultation       Cardiovascular hypertension, Normal cardiovascular exam+ dysrhythmias  Rhythm:Regular Rate:Normal     Neuro/Psych negative neurological ROS  negative psych ROS   GI/Hepatic hiatal hernia, GERD  Controlled,  Endo/Other  diabetes, Well Controlled  Renal/GU Renal InsufficiencyRenal disease     Musculoskeletal   Abdominal (+) + obese,   Peds  Hematology negative hematology ROS (+)   Anesthesia Other Findings   Reproductive/Obstetrics                             Anesthesia Physical Anesthesia Plan  ASA: II  Anesthesia Plan: MAC   Post-op Pain Management:    Induction: Intravenous  PONV Risk Score and Plan:   Airway Management Planned: Natural Airway and Nasal Cannula  Additional Equipment:   Intra-op Plan:   Post-operative Plan: Extubation in OR  Informed Consent: I have reviewed the patients History and Physical, chart, labs and discussed the procedure including the risks, benefits and alternatives for the proposed anesthesia with the patient or authorized representative who has indicated his/her understanding and acceptance.   Dental advisory given  Plan Discussed with: CRNA  Anesthesia Plan Comments:         Anesthesia Quick Evaluation

## 2017-10-31 NOTE — Anesthesia Postprocedure Evaluation (Signed)
Anesthesia Post Note  Patient: Gail Mccarthy  Procedure(s) Performed: CATARACT EXTRACTION PHACO AND INTRAOCULAR LENS PLACEMENT (IOC) LEFT (Left Eye)  Patient location during evaluation: PACU Anesthesia Type: MAC Level of consciousness: awake and alert Pain management: pain level controlled Vital Signs Assessment: post-procedure vital signs reviewed and stable Respiratory status: spontaneous breathing, nonlabored ventilation, respiratory function stable and patient connected to nasal cannula oxygen Cardiovascular status: stable and blood pressure returned to baseline Postop Assessment: no apparent nausea or vomiting Anesthetic complications: no    Edmar Blankenburg C

## 2017-10-31 NOTE — Transfer of Care (Signed)
Immediate Anesthesia Transfer of Care Note  Patient: Gail Mccarthy  Procedure(s) Performed: CATARACT EXTRACTION PHACO AND INTRAOCULAR LENS PLACEMENT (IOC) LEFT (Left Eye)  Patient Location: PACU  Anesthesia Type: MAC  Level of Consciousness: awake, alert  and patient cooperative  Airway and Oxygen Therapy: Patient Spontanous Breathing and Patient connected to supplemental oxygen  Post-op Assessment: Post-op Vital signs reviewed, Patient's Cardiovascular Status Stable, Respiratory Function Stable, Patent Airway and No signs of Nausea or vomiting  Post-op Vital Signs: Reviewed and stable  Complications: No apparent anesthesia complications

## 2017-10-31 NOTE — H&P (Signed)
The History and Physical notes are on paper, have been signed, and are to be scanned. The patient remains stable and unchanged from the H&P.   Previous H&P reviewed, patient examined, and there are no changes.  Oberon Hehir 10/31/2017 7:32 AM

## 2017-10-31 NOTE — Op Note (Signed)
OPERATIVE NOTE  PEGAH SEGEL [id number] 10/31/2017   PREOPERATIVE DIAGNOSIS:  Nuclear sclerotic cataract left eye. H25.12   POSTOPERATIVE DIAGNOSIS:    Nuclear sclerotic cataract left eye.     PROCEDURE:  Phacoemusification with posterior chamber intraocular lens placement of the left eye   LENS:   Implant Name Type Inv. Item Serial No. Manufacturer Lot No. LRB No. Used  LENS IOL ACRYSOF IQ 21.5 - [id number] Intraocular Lens LENS IOL ACRYSOF IQ 21.5 [id number] [employer]  Left 1        ULTRASOUND TIME: 17  % of 1 minutes 2 seconds, CDE 10.6  SURGEON:  Wyonia Hough, MD   ANESTHESIA:  Topical with tetracaine drops and 2% Xylocaine jelly, augmented with 1% preservative-free intracameral lidocaine.    COMPLICATIONS:  None.   DESCRIPTION OF PROCEDURE:  The patient was identified in the holding room and transported to the operating room and placed in the supine position under the operating microscope.  The left eye was identified as the operative eye and it was prepped and draped in the usual sterile ophthalmic fashion.   A 1 millimeter clear-corneal paracentesis was made at the 1:30 position.  0.5 ml of preservative-free 1% lidocaine was injected into the anterior chamber.  The anterior chamber was filled with Viscoat viscoelastic.  A 2.4 millimeter keratome was used to make a near-clear corneal incision at the 10:30 position.  .  A curvilinear capsulorrhexis was made with a cystotome and capsulorrhexis forceps.  Balanced salt solution was used to hydrodissect and hydrodelineate the nucleus.   Phacoemulsification was then used in stop and chop fashion to remove the lens nucleus and epinucleus.  The remaining cortex was then removed using the irrigation and aspiration handpiece. Provisc was then placed into the capsular bag to distend it for lens placement.  A lens was then injected into the capsular bag.  The remaining viscoelastic was aspirated.   Wounds were hydrated with  balanced salt solution.  The anterior chamber was inflated to a physiologic pressure with balanced salt solution.  No wound leaks were noted. Cefuroxime 0.1 ml of a 10mg /ml solution was injected into the anterior chamber for a dose of 1 mg of intracameral antibiotic at the completion of the case.   Timolol and Brimonidine drops were applied to the eye.  The patient was taken to the recovery room in stable condition without complications of anesthesia or surgery.  Dequane Strahan 10/31/2017, 8:28 AM

## 2017-11-01 ENCOUNTER — Encounter: Payer: Self-pay | Admitting: Ophthalmology

## 2017-12-07 DIAGNOSIS — R768 Other specified abnormal immunological findings in serum: Secondary | ICD-10-CM | POA: Diagnosis not present

## 2017-12-07 DIAGNOSIS — R760 Raised antibody titer: Secondary | ICD-10-CM | POA: Diagnosis not present

## 2017-12-07 DIAGNOSIS — N183 Chronic kidney disease, stage 3 (moderate): Secondary | ICD-10-CM | POA: Diagnosis not present

## 2017-12-12 DIAGNOSIS — H35342 Macular cyst, hole, or pseudohole, left eye: Secondary | ICD-10-CM | POA: Diagnosis not present

## 2017-12-15 DIAGNOSIS — N2581 Secondary hyperparathyroidism of renal origin: Secondary | ICD-10-CM | POA: Diagnosis not present

## 2017-12-15 DIAGNOSIS — E119 Type 2 diabetes mellitus without complications: Secondary | ICD-10-CM | POA: Diagnosis not present

## 2017-12-15 DIAGNOSIS — I10 Essential (primary) hypertension: Secondary | ICD-10-CM | POA: Diagnosis not present

## 2017-12-15 DIAGNOSIS — N183 Chronic kidney disease, stage 3 (moderate): Secondary | ICD-10-CM | POA: Diagnosis not present

## 2017-12-20 DIAGNOSIS — E785 Hyperlipidemia, unspecified: Secondary | ICD-10-CM | POA: Diagnosis not present

## 2017-12-20 DIAGNOSIS — I10 Essential (primary) hypertension: Secondary | ICD-10-CM | POA: Diagnosis not present

## 2017-12-28 DIAGNOSIS — I10 Essential (primary) hypertension: Secondary | ICD-10-CM | POA: Diagnosis not present

## 2017-12-28 DIAGNOSIS — R739 Hyperglycemia, unspecified: Secondary | ICD-10-CM | POA: Diagnosis not present

## 2017-12-28 DIAGNOSIS — E785 Hyperlipidemia, unspecified: Secondary | ICD-10-CM | POA: Diagnosis not present

## 2017-12-28 DIAGNOSIS — N183 Chronic kidney disease, stage 3 (moderate): Secondary | ICD-10-CM | POA: Diagnosis not present

## 2017-12-28 DIAGNOSIS — K760 Fatty (change of) liver, not elsewhere classified: Secondary | ICD-10-CM | POA: Diagnosis not present

## 2018-01-10 DIAGNOSIS — Z23 Encounter for immunization: Secondary | ICD-10-CM | POA: Diagnosis not present

## 2018-01-30 DIAGNOSIS — N816 Rectocele: Secondary | ICD-10-CM | POA: Diagnosis not present

## 2018-01-30 DIAGNOSIS — R32 Unspecified urinary incontinence: Secondary | ICD-10-CM | POA: Diagnosis not present

## 2018-01-30 DIAGNOSIS — R300 Dysuria: Secondary | ICD-10-CM | POA: Diagnosis not present

## 2018-01-30 DIAGNOSIS — Z01411 Encounter for gynecological examination (general) (routine) with abnormal findings: Secondary | ICD-10-CM | POA: Diagnosis not present

## 2018-02-09 DIAGNOSIS — Z1211 Encounter for screening for malignant neoplasm of colon: Secondary | ICD-10-CM | POA: Diagnosis not present

## 2018-02-10 IMAGING — MG MM DIGITAL DIAGNOSTIC BILAT W/ TOMO W/ CAD
8 of 13 series · 8 of 29 positions shown · non-contrast
Comparison: Previous exam(s).

CLINICAL DATA: Patient with history of right breast lumpectomy
7934.

EXAM:
2D DIGITAL DIAGNOSTIC BILATERAL MAMMOGRAM WITH CAD AND ADJUNCT TOMO

[R CC (1 of 2)]
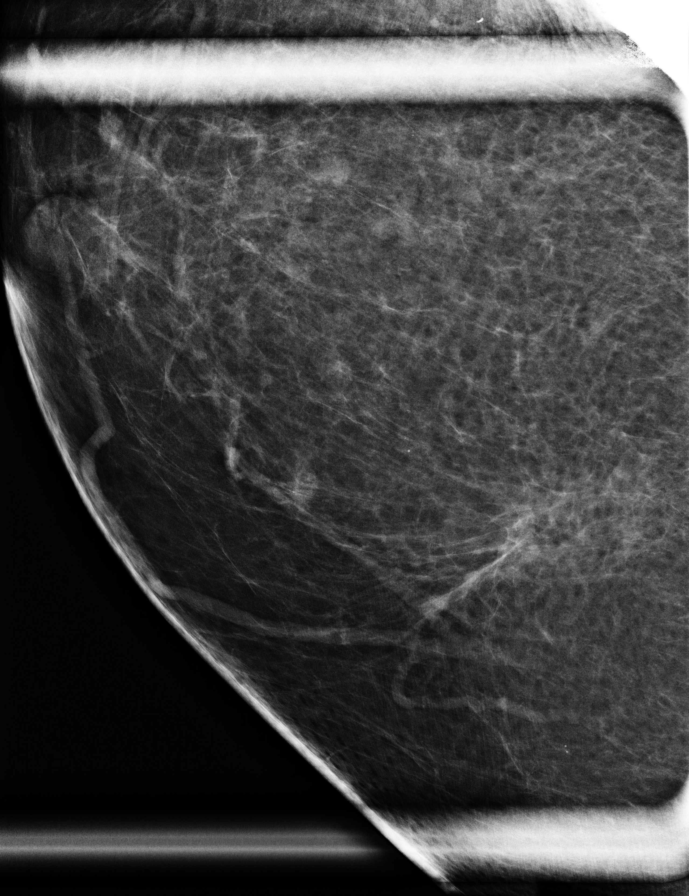

[L MLO synth-2D]
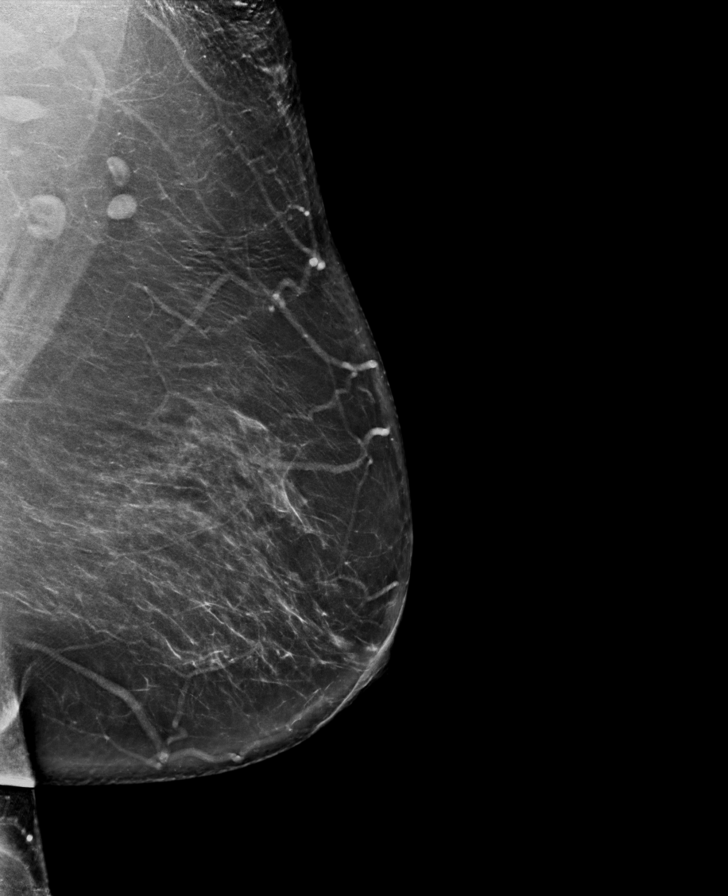

[R CC (2 of 2)]
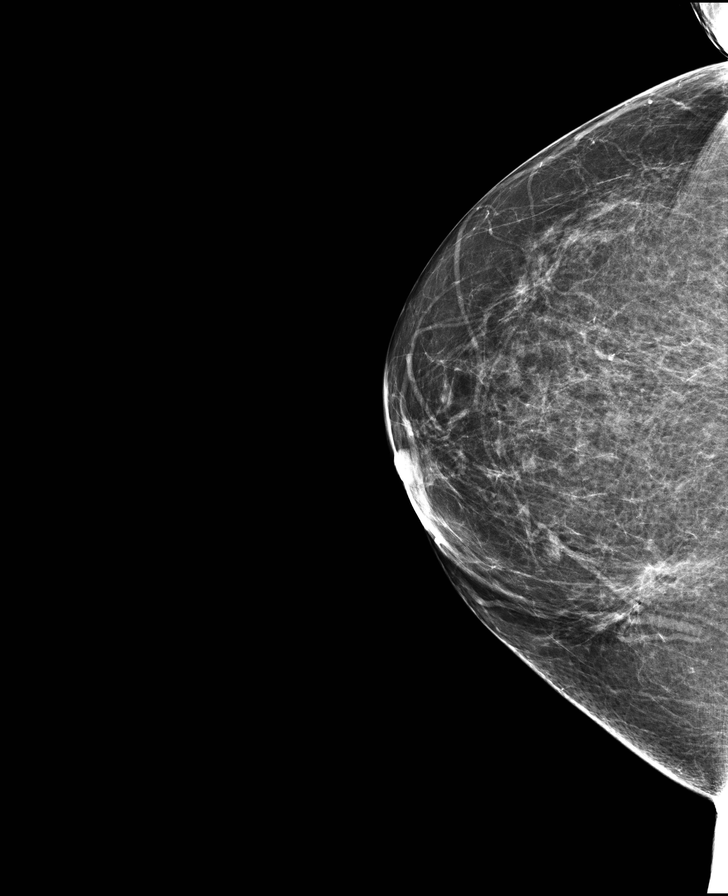

[L CC]
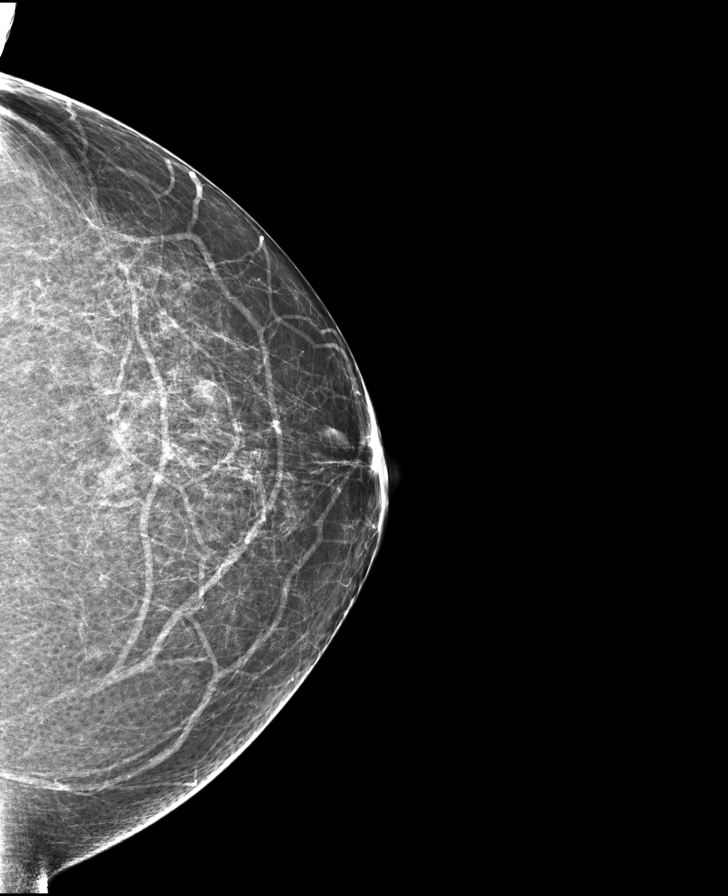

[R MLO synth-2D]
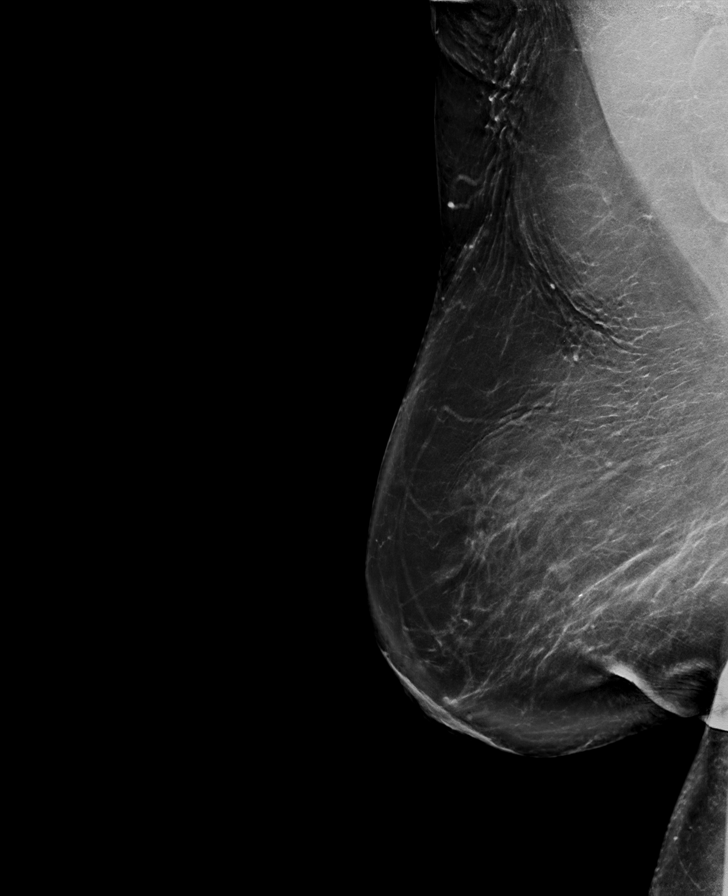

[L MLO]
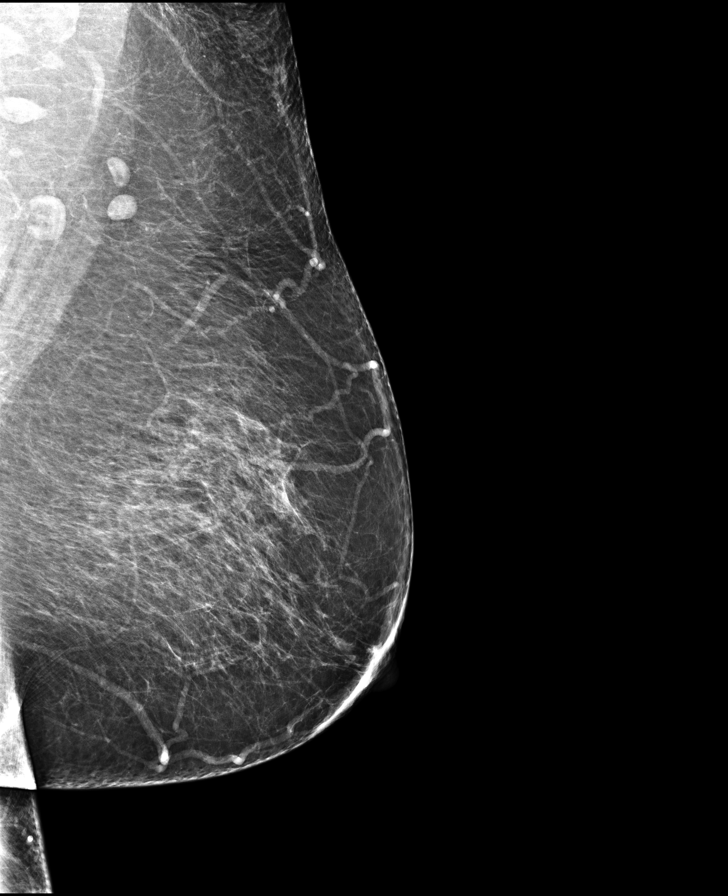

[L CC synth-2D]
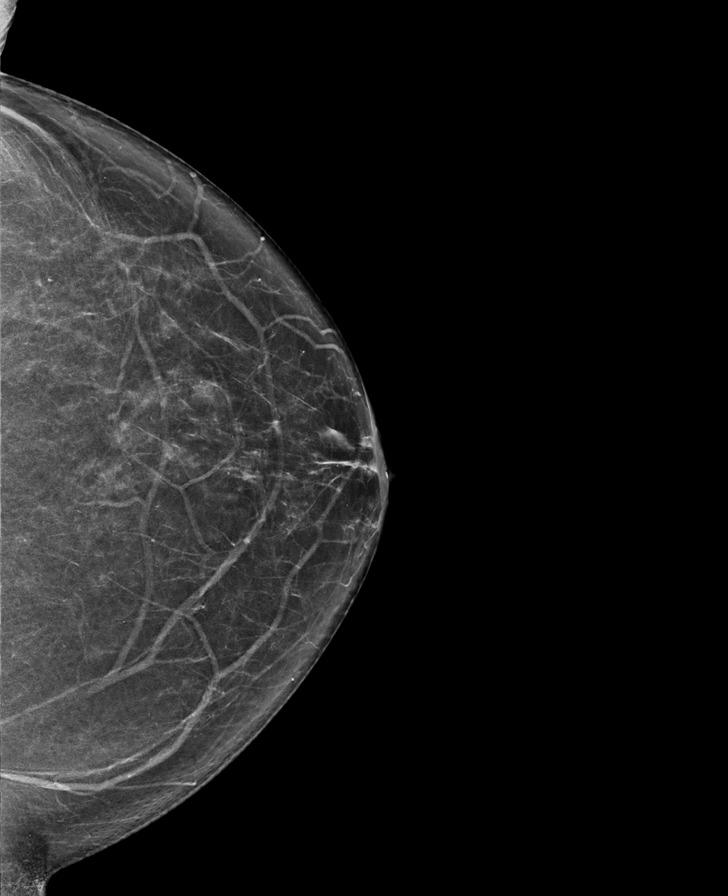

[R CC synth-2D]
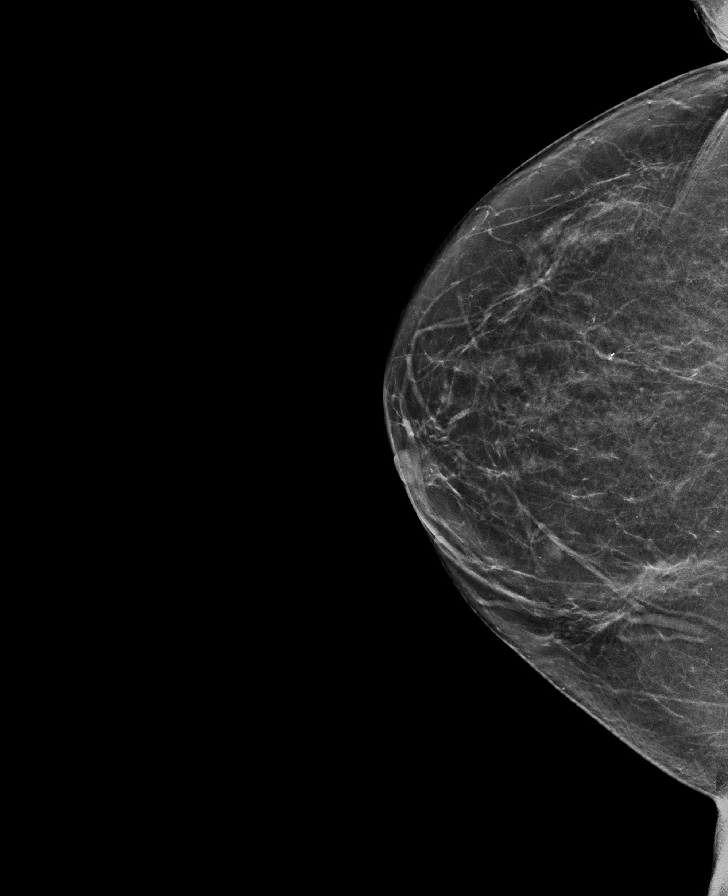

[8 of 29 positions shown; findings below may reference images not displayed]

ACR Breast Density Category b: There are scattered areas of
fibroglandular density.
FINDINGS: Stable postlumpectomy changes right breast. No concerning masses,
calcifications or nonsurgical distortion identified within either
breast.

Mammographic images were processed with CAD.
IMPRESSION: No mammographic evidence for malignancy.

RECOMMENDATION:
Bilateral diagnostic mammography 1 year.

I have discussed the findings and recommendations with the patient.
Results were also provided in writing at the conclusion of the
visit. If applicable, a reminder letter will be sent to the patient
regarding the next appointment.

BI-RADS CATEGORY  2: Benign.

## 2018-03-23 ENCOUNTER — Other Ambulatory Visit: Payer: Self-pay | Admitting: Internal Medicine

## 2018-03-23 DIAGNOSIS — K7689 Other specified diseases of liver: Secondary | ICD-10-CM | POA: Diagnosis not present

## 2018-03-23 DIAGNOSIS — R1011 Right upper quadrant pain: Secondary | ICD-10-CM | POA: Diagnosis not present

## 2018-04-02 ENCOUNTER — Other Ambulatory Visit (HOSPITAL_COMMUNITY): Payer: Self-pay | Admitting: Internal Medicine

## 2018-04-02 ENCOUNTER — Other Ambulatory Visit: Payer: Self-pay | Admitting: Internal Medicine

## 2018-04-02 DIAGNOSIS — K7689 Other specified diseases of liver: Secondary | ICD-10-CM

## 2018-04-02 DIAGNOSIS — R1011 Right upper quadrant pain: Secondary | ICD-10-CM

## 2018-04-08 ENCOUNTER — Ambulatory Visit
Admission: RE | Admit: 2018-04-08 | Discharge: 2018-04-08 | Disposition: A | Discharge status: Home / Self Care | Payer: Medicare Other | Source: Ambulatory Visit | Attending: Internal Medicine | Admitting: Internal Medicine

## 2018-04-08 DIAGNOSIS — R1011 Right upper quadrant pain: Secondary | ICD-10-CM | POA: Diagnosis not present

## 2018-04-08 DIAGNOSIS — K7689 Other specified diseases of liver: Secondary | ICD-10-CM | POA: Diagnosis not present

## 2018-04-10 DIAGNOSIS — H353124 Nonexudative age-related macular degeneration, left eye, advanced atrophic with subfoveal involvement: Secondary | ICD-10-CM | POA: Diagnosis not present

## 2018-04-20 DIAGNOSIS — I10 Essential (primary) hypertension: Secondary | ICD-10-CM | POA: Diagnosis not present

## 2018-04-20 DIAGNOSIS — N183 Chronic kidney disease, stage 3 (moderate): Secondary | ICD-10-CM | POA: Diagnosis not present

## 2018-04-20 DIAGNOSIS — N2581 Secondary hyperparathyroidism of renal origin: Secondary | ICD-10-CM | POA: Diagnosis not present

## 2018-04-20 DIAGNOSIS — E119 Type 2 diabetes mellitus without complications: Secondary | ICD-10-CM | POA: Diagnosis not present

## 2018-04-25 DIAGNOSIS — B001 Herpesviral vesicular dermatitis: Secondary | ICD-10-CM | POA: Diagnosis not present

## 2018-04-25 DIAGNOSIS — B1001 Human herpesvirus 6 encephalitis: Secondary | ICD-10-CM | POA: Diagnosis not present

## 2018-06-21 DIAGNOSIS — R739 Hyperglycemia, unspecified: Secondary | ICD-10-CM | POA: Diagnosis not present

## 2018-06-21 DIAGNOSIS — N183 Chronic kidney disease, stage 3 (moderate): Secondary | ICD-10-CM | POA: Diagnosis not present

## 2018-06-28 DIAGNOSIS — N183 Chronic kidney disease, stage 3 (moderate): Secondary | ICD-10-CM | POA: Diagnosis not present

## 2018-08-06 ENCOUNTER — Ambulatory Visit
Admission: RE | Admit: 2018-08-06 | Discharge: 2018-08-06 | Disposition: A | Discharge status: Home / Self Care | Payer: Medicare Other | Source: Ambulatory Visit | Attending: Oncology | Admitting: Oncology

## 2018-08-06 ENCOUNTER — Other Ambulatory Visit: Payer: Self-pay

## 2018-08-06 DIAGNOSIS — D0511 Intraductal carcinoma in situ of right breast: Secondary | ICD-10-CM | POA: Insufficient documentation

## 2018-08-06 DIAGNOSIS — R928 Other abnormal and inconclusive findings on diagnostic imaging of breast: Secondary | ICD-10-CM | POA: Diagnosis not present

## 2018-08-24 DIAGNOSIS — N2581 Secondary hyperparathyroidism of renal origin: Secondary | ICD-10-CM | POA: Diagnosis not present

## 2018-08-24 DIAGNOSIS — N183 Chronic kidney disease, stage 3 (moderate): Secondary | ICD-10-CM | POA: Diagnosis not present

## 2018-08-24 DIAGNOSIS — I10 Essential (primary) hypertension: Secondary | ICD-10-CM | POA: Diagnosis not present

## 2018-08-24 DIAGNOSIS — E1122 Type 2 diabetes mellitus with diabetic chronic kidney disease: Secondary | ICD-10-CM | POA: Diagnosis not present

## 2018-09-20 DIAGNOSIS — N183 Chronic kidney disease, stage 3 (moderate): Secondary | ICD-10-CM | POA: Diagnosis not present

## 2018-09-20 DIAGNOSIS — E119 Type 2 diabetes mellitus without complications: Secondary | ICD-10-CM | POA: Diagnosis not present

## 2018-09-27 DIAGNOSIS — Z0000 Encounter for general adult medical examination without abnormal findings: Secondary | ICD-10-CM | POA: Diagnosis not present

## 2018-09-27 DIAGNOSIS — E119 Type 2 diabetes mellitus without complications: Secondary | ICD-10-CM | POA: Diagnosis not present

## 2018-09-27 DIAGNOSIS — K760 Fatty (change of) liver, not elsewhere classified: Secondary | ICD-10-CM | POA: Diagnosis not present

## 2018-09-27 DIAGNOSIS — N183 Chronic kidney disease, stage 3 (moderate): Secondary | ICD-10-CM | POA: Diagnosis not present

## 2018-09-27 DIAGNOSIS — I10 Essential (primary) hypertension: Secondary | ICD-10-CM | POA: Diagnosis not present

## 2018-09-27 DIAGNOSIS — E785 Hyperlipidemia, unspecified: Secondary | ICD-10-CM | POA: Diagnosis not present

## 2018-10-03 ENCOUNTER — Other Ambulatory Visit: Payer: Self-pay

## 2018-10-04 ENCOUNTER — Encounter: Payer: Self-pay | Admitting: Oncology

## 2018-10-04 ENCOUNTER — Inpatient Hospital Stay: Payer: Medicare Other | Attending: Oncology | Admitting: Oncology

## 2018-10-04 ENCOUNTER — Other Ambulatory Visit: Payer: Self-pay

## 2018-10-04 VITALS — BP 134/70 | HR 61 | Temp 98.5°F | Ht 65.0 in | Wt 230.0 lb

## 2018-10-04 DIAGNOSIS — Z86000 Personal history of in-situ neoplasm of breast: Secondary | ICD-10-CM

## 2018-10-04 DIAGNOSIS — E785 Hyperlipidemia, unspecified: Secondary | ICD-10-CM | POA: Insufficient documentation

## 2018-10-04 DIAGNOSIS — K219 Gastro-esophageal reflux disease without esophagitis: Secondary | ICD-10-CM | POA: Diagnosis not present

## 2018-10-04 DIAGNOSIS — N183 Chronic kidney disease, stage 3 unspecified: Secondary | ICD-10-CM | POA: Insufficient documentation

## 2018-10-04 DIAGNOSIS — D0511 Intraductal carcinoma in situ of right breast: Secondary | ICD-10-CM

## 2018-10-04 DIAGNOSIS — K5790 Diverticulosis of intestine, part unspecified, without perforation or abscess without bleeding: Secondary | ICD-10-CM | POA: Insufficient documentation

## 2018-10-04 DIAGNOSIS — D126 Benign neoplasm of colon, unspecified: Secondary | ICD-10-CM | POA: Insufficient documentation

## 2018-10-04 DIAGNOSIS — Z803 Family history of malignant neoplasm of breast: Secondary | ICD-10-CM | POA: Diagnosis not present

## 2018-10-04 DIAGNOSIS — I10 Essential (primary) hypertension: Secondary | ICD-10-CM | POA: Insufficient documentation

## 2018-10-04 DIAGNOSIS — R160 Hepatomegaly, not elsewhere classified: Secondary | ICD-10-CM | POA: Insufficient documentation

## 2018-10-04 DIAGNOSIS — K760 Fatty (change of) liver, not elsewhere classified: Secondary | ICD-10-CM | POA: Insufficient documentation

## 2018-10-04 DIAGNOSIS — Z7982 Long term (current) use of aspirin: Secondary | ICD-10-CM | POA: Insufficient documentation

## 2018-10-04 DIAGNOSIS — Z79899 Other long term (current) drug therapy: Secondary | ICD-10-CM | POA: Diagnosis not present

## 2018-10-04 DIAGNOSIS — I454 Nonspecific intraventricular block: Secondary | ICD-10-CM | POA: Insufficient documentation

## 2018-10-04 NOTE — Progress Notes (Signed)
Patient stated that she had been doing well with no complaints. 

## 2018-10-05 NOTE — Progress Notes (Signed)
[place]  [phone] [phone]) [phone]  ID: Gail Mccarthy OB: 1940/05/28  MR#: [id number]  NGE#:[id number]  Patient Care Team: Baxter Hire, MD as PCP - General (Internal Medicine)  CHIEF COMPLAINT: DCIS of right breast  INTERVAL HISTORY: Patient returns to clinic today for routine yearly evaluation.  She continues to feel well and remains asymptomatic.  She no longer is on tamoxifen.  She has no neurologic complaints.  She denies any recent fevers or illnesses.  She has a good appetite and denies weight loss.  She denies any chest pain, shortness of breath, cough, or hemoptysis.  She denies any nausea, vomiting, constipation, or diarrhea.  She has no urinary complaints.  Patient feels at her baseline offers no specific complaints today.    REVIEW OF SYSTEMS:   Review of Systems  Constitutional: Negative.  Negative for fever, malaise/fatigue and weight loss.  Respiratory: Negative.  Negative for cough and shortness of breath.   Cardiovascular: Negative.  Negative for chest pain and leg swelling.  Gastrointestinal: Negative.  Negative for abdominal pain.  Genitourinary: Negative.  Negative for dysuria.  Musculoskeletal: Negative.  Negative for myalgias.  Skin: Negative.  Negative for rash.  Neurological: Negative.  Negative for sensory change, focal weakness, weakness and headaches.  Psychiatric/Behavioral: Negative.  The patient is not nervous/anxious.     As per HPI. Otherwise, a complete review of systems is negative.  PAST MEDICAL HISTORY: Past Medical History:  Diagnosis Date  . Breast cancer ([place]) 2014   RT LUMPECTOMY  . Cough    CHRONIC  . Diabetes mellitus without complication ([place])    no meds since 2012  . Diverticulosis   . Dizziness    MEDICINE RELATED  . Dysrhythmia   . GERD (gastroesophageal reflux disease)   . Heart palpitations   . Hepatic steatosis   . High cholesterol   . History of hiatal hernia   . History of  partial mastectomy of right breast   . Hypertension   . IBS (irritable bowel syndrome)   . Liver mass   . Motion sickness    cars  . Neoplasm of kidney   . 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 Personal history of radiation therapy 2014   BREAST CA  . Radiation 2014   BREAST CA  . Renal cell carcinoma ([place]) 5005  . Renal insufficiency     PAST SURGICAL HISTORY: Past Surgical History:  Procedure Laterality Date  . APPENDECTOMY    . BREAST EXCISIONAL BIOPSY Right 08/20/2012   hx of breast ca neg but close .59mm margins radation no chemo   . BREAST LUMPECTOMY Right 2014   BREAST CA  . CATARACT EXTRACTION W/PHACO Right 10/27/2014   Procedure: CATARACT EXTRACTION PHACO AND INTRAOCULAR LENS PLACEMENT (IOC);  Surgeon: Estill Cotta, MD;  Location: [facility] ORS;  Service: Ophthalmology;  Laterality: Right;  [place]: 01:30.0 AP%: 25.7 CDE: 40.11 Fluid lot# [id number]  . CATARACT EXTRACTION W/PHACO Left 10/31/2017   Procedure: CATARACT EXTRACTION PHACO AND INTRAOCULAR LENS PLACEMENT ([place]) LEFT;  Surgeon: Leandrew Koyanagi, MD;  Location: [place];  Service: Ophthalmology;  Laterality: Left;  . CHOLECYSTECTOMY    . COLONOSCOPY WITH PROPOFOL N/A 10/08/2014   Procedure: COLONOSCOPY WITH PROPOFOL;  Surgeon: Manya Silvas, MD;  Location: [facility] ENDOSCOPY;  Service: Endoscopy;  Laterality: N/A;  . ESOPHAGOGASTRODUODENOSCOPY (EGD) WITH PROPOFOL N/A 10/08/2014   Procedure: ESOPHAGOGASTRODUODENOSCOPY (EGD) WITH PROPOFOL;  Surgeon: Manya Silvas, MD;  Location: [facility] ENDOSCOPY;  Service: Endoscopy;  Laterality:  N/A;  . EYE SURGERY     macular hole repair  . LEG SURGERY Right    FX PINS,SCREWS  . NEPHRECTOMT Right 06/2003  . TONSILLECTOMY      FAMILY HISTORY Family History  Problem Relation Age of Onset  . Diabetes Other   . COPD Other   . Colitis Other   . Breast cancer Paternal Aunt        ADVANCED DIRECTIVES:    HEALTH MAINTENANCE: Social History   Tobacco Use  . Smoking  status: Never Smoker  . Smokeless tobacco: Never Used  Substance Use Topics  . Alcohol use: No  . Drug use: Not on file     Colonoscopy:  PAP:  Bone density:  Lipid panel:  Allergies  Allergen Reactions  . Amoxicillin-Pot Clavulanate Nausea Only  . Cefdinir Nausea And Vomiting  . Codeine Nausea Only  . Clindamycin Nausea And Vomiting    Current Outpatient Medications  Medication Sig Dispense Refill  . acetaminophen (TYLENOL) 500 MG tablet Take 1,000 mg by mouth every 6 (six) hours as needed.    Marland Kitchen amLODipine (NORVASC) 2.5 MG tablet   5  . aspirin 81 MG tablet Take 81 mg by mouth daily.    . benazepril (LOTENSIN) 10 MG tablet Take 10 mg by mouth daily.    . calcitRIOL (ROCALTROL) 0.25 MCG capsule Take 0.25 mcg by mouth daily.    . Cholecalciferol (VITAMIN D) 2000 units tablet Take 2,000 Units by mouth daily.    . hydroxychloroquine (PLAQUENIL) 200 MG tablet Take 200 mg by mouth daily.     . Multiple Vitamins-Minerals (PRESERVISION AREDS) TABS Take 60 mg by mouth daily.    . pantoprazole (PROTONIX) 40 MG tablet Take 40 mg by mouth daily.    . sucralfate (CARAFATE) 1 G tablet Take 1 g by mouth 4 (four) times daily -  with meals and at bedtime.    . tamoxifen (NOLVADEX) 20 MG tablet Take 1 tablet (20 mg total) by mouth daily. 90 tablet 0  . traMADol (ULTRAM) 50 MG tablet Take by mouth.    . triamterene-hydrochlorothiazide (MAXZIDE-25) 37.5-25 MG per tablet Take 1 tablet by mouth daily.     No current facility-administered medications for this visit.     OBJECTIVE: Vitals:   10/04/18 1415  BP: 134/70  Pulse: 61  Temp: 98.5 F (36.9 C)     Body mass index is 38.27 kg/m.    ECOG FS:0 - Asymptomatic  General: Well-developed, well-nourished, no acute distress. Eyes: Pink conjunctiva, anicteric sclera. HEENT: Normocephalic, moist mucous membranes. Breast: Bilateral breast and axilla without lumps or masses. Lungs: Clear to auscultation bilaterally. Heart: Regular rate and  rhythm. No rubs, murmurs, or gallops. Abdomen: Soft, nontender, nondistended. No organomegaly noted, normoactive bowel sounds. Musculoskeletal: No edema, cyanosis, or clubbing. Neuro: Alert, answering all questions appropriately. Cranial nerves grossly intact. Skin: No rashes or petechiae noted. Psych: Normal affect.  LAB RESULTS:  Lab Results  Component Value Date   K 4.1 01/10/2014    Lab Results  Component Value Date   WBC 7.1 03/16/2016   NEUTROABS 4.9 03/16/2016   HGB 13.4 03/16/2016   HCT 40.2 03/16/2016   MCV 82.7 03/16/2016   PLT 154 03/16/2016     STUDIES: No results found.  ASSESSMENT: DCIS, right breast.  PLAN:    1.  DCIS of right breast: No evidence of disease.  Patient completed 5 years of tamoxifen in August 2019.  No further interventions are needed.  Patient's  most recent mammogram on Aug 06, 2018 was reported as BI-RADS 2.  Patient now can be switched to yearly screening mammograms.  After lengthy discussion with the patient, it was agreed upon that no further follow-up is necessary and her mammograms can be ordered by her primary care physician.  Please refer patient back if there are any questions or concerns.    I spent a total of 20 minutes face-to-face with the patient of which greater than 50% of the visit was spent in counseling and coordination of care as detailed above.   Patient expressed understanding and was in agreement with this plan. She also understands that She can call clinic at any time with any questions, concerns, or complaints.   DCIS (ductal carcinoma in situ)   Staging form: Breast, AJCC 7th Edition     Clinical stage from 09/21/2014: Stage 0 (Tis (DCIS), N0, M0) - Signed by Lloyd Huger, MD on 09/21/2014   Lloyd Huger, MD   10/05/2018 10:38 AM

## 2018-11-20 DIAGNOSIS — H353124 Nonexudative age-related macular degeneration, left eye, advanced atrophic with subfoveal involvement: Secondary | ICD-10-CM | POA: Diagnosis not present

## 2018-12-10 DIAGNOSIS — G8929 Other chronic pain: Secondary | ICD-10-CM | POA: Diagnosis not present

## 2018-12-10 DIAGNOSIS — R768 Other specified abnormal immunological findings in serum: Secondary | ICD-10-CM | POA: Diagnosis not present

## 2018-12-10 DIAGNOSIS — M25561 Pain in right knee: Secondary | ICD-10-CM | POA: Diagnosis not present

## 2018-12-10 DIAGNOSIS — M8949 Other hypertrophic osteoarthropathy, multiple sites: Secondary | ICD-10-CM | POA: Diagnosis not present

## 2018-12-10 DIAGNOSIS — R760 Raised antibody titer: Secondary | ICD-10-CM | POA: Diagnosis not present

## 2018-12-10 DIAGNOSIS — N183 Chronic kidney disease, stage 3 (moderate): Secondary | ICD-10-CM | POA: Diagnosis not present

## 2018-12-28 DIAGNOSIS — E119 Type 2 diabetes mellitus without complications: Secondary | ICD-10-CM | POA: Diagnosis not present

## 2018-12-28 DIAGNOSIS — Z23 Encounter for immunization: Secondary | ICD-10-CM | POA: Diagnosis not present

## 2018-12-28 DIAGNOSIS — I10 Essential (primary) hypertension: Secondary | ICD-10-CM | POA: Diagnosis not present

## 2018-12-28 DIAGNOSIS — N2581 Secondary hyperparathyroidism of renal origin: Secondary | ICD-10-CM | POA: Diagnosis not present

## 2018-12-28 DIAGNOSIS — N1832 Chronic kidney disease, stage 3b: Secondary | ICD-10-CM | POA: Diagnosis not present

## 2019-05-06 ENCOUNTER — Other Ambulatory Visit: Payer: Self-pay | Admitting: Internal Medicine

## 2019-05-06 DIAGNOSIS — Z1231 Encounter for screening mammogram for malignant neoplasm of breast: Secondary | ICD-10-CM

## 2019-08-07 ENCOUNTER — Ambulatory Visit
Admission: RE | Admit: 2019-08-07 | Discharge: 2019-08-07 | Disposition: A | Discharge status: Home / Self Care | Payer: Medicare Other | Source: Ambulatory Visit | Attending: Internal Medicine | Admitting: Internal Medicine

## 2019-08-07 DIAGNOSIS — Z1231 Encounter for screening mammogram for malignant neoplasm of breast: Secondary | ICD-10-CM

## 2019-12-23 ENCOUNTER — Other Ambulatory Visit: Payer: Self-pay

## 2019-12-23 ENCOUNTER — Other Ambulatory Visit
Admission: RE | Admit: 2019-12-23 | Discharge: 2019-12-23 | Disposition: A | Discharge status: Home / Self Care | Payer: Medicare Other | Source: Ambulatory Visit | Attending: General Surgery | Admitting: General Surgery

## 2019-12-23 DIAGNOSIS — Z20822 Contact with and (suspected) exposure to covid-19: Secondary | ICD-10-CM | POA: Diagnosis not present

## 2019-12-23 DIAGNOSIS — Z01812 Encounter for preprocedural laboratory examination: Secondary | ICD-10-CM | POA: Diagnosis present

## 2019-12-23 LAB — SARS CORONAVIRUS 2 (TAT 6-24 HRS): SARS Coronavirus 2: NEGATIVE

## 2019-12-24 ENCOUNTER — Encounter: Payer: Self-pay | Admitting: General Surgery

## 2019-12-25 ENCOUNTER — Ambulatory Visit: Payer: Medicare Other | Admitting: Registered Nurse

## 2019-12-25 ENCOUNTER — Encounter
Admission: RE | Disposition: A | Discharge status: Home / Self Care | Payer: Self-pay | Source: Home / Self Care | Attending: General Surgery

## 2019-12-25 ENCOUNTER — Other Ambulatory Visit: Payer: Self-pay

## 2019-12-25 ENCOUNTER — Encounter: Payer: Self-pay | Admitting: General Surgery

## 2019-12-25 ENCOUNTER — Ambulatory Visit
Admission: RE | Admit: 2019-12-25 | Discharge: 2019-12-25 | Disposition: A | Discharge status: Home / Self Care | Payer: Medicare Other | Attending: General Surgery | Admitting: General Surgery

## 2019-12-25 DIAGNOSIS — K219 Gastro-esophageal reflux disease without esophagitis: Secondary | ICD-10-CM | POA: Insufficient documentation

## 2019-12-25 DIAGNOSIS — Z85528 Personal history of other malignant neoplasm of kidney: Secondary | ICD-10-CM | POA: Insufficient documentation

## 2019-12-25 DIAGNOSIS — Z923 Personal history of irradiation: Secondary | ICD-10-CM | POA: Diagnosis not present

## 2019-12-25 DIAGNOSIS — K5730 Diverticulosis of large intestine without perforation or abscess without bleeding: Secondary | ICD-10-CM | POA: Insufficient documentation

## 2019-12-25 DIAGNOSIS — Z7984 Long term (current) use of oral hypoglycemic drugs: Secondary | ICD-10-CM | POA: Insufficient documentation

## 2019-12-25 DIAGNOSIS — Z79899 Other long term (current) drug therapy: Secondary | ICD-10-CM | POA: Insufficient documentation

## 2019-12-25 DIAGNOSIS — Z1211 Encounter for screening for malignant neoplasm of colon: Secondary | ICD-10-CM | POA: Diagnosis not present

## 2019-12-25 DIAGNOSIS — Z853 Personal history of malignant neoplasm of breast: Secondary | ICD-10-CM | POA: Diagnosis not present

## 2019-12-25 DIAGNOSIS — E119 Type 2 diabetes mellitus without complications: Secondary | ICD-10-CM | POA: Insufficient documentation

## 2019-12-25 DIAGNOSIS — Z7982 Long term (current) use of aspirin: Secondary | ICD-10-CM | POA: Insufficient documentation

## 2019-12-25 DIAGNOSIS — Z8719 Personal history of other diseases of the digestive system: Secondary | ICD-10-CM | POA: Diagnosis not present

## 2019-12-25 DIAGNOSIS — E7800 Pure hypercholesterolemia, unspecified: Secondary | ICD-10-CM | POA: Diagnosis not present

## 2019-12-25 HISTORY — PX: COLONOSCOPY WITH PROPOFOL: SHX5780

## 2019-12-25 LAB — GLUCOSE, CAPILLARY: Glucose-Capillary: 109 mg/dL — ABNORMAL HIGH (ref 70–99)

## 2019-12-25 SURGERY — COLONOSCOPY WITH PROPOFOL
Anesthesia: General

## 2019-12-25 MED ORDER — PROPOFOL 500 MG/50ML IV EMUL
INTRAVENOUS | Status: AC
  Filled 2019-12-25: qty 50

## 2019-12-25 MED ORDER — SODIUM CHLORIDE 0.9 % IV SOLN: INTRAVENOUS | Status: DC

## 2019-12-25 MED ORDER — PROPOFOL 500 MG/50ML IV EMUL
INTRAVENOUS | Status: DC | PRN
  Administered 2019-12-25: 150 ug/kg/min via INTRAVENOUS

## 2019-12-25 MED ORDER — PROPOFOL 10 MG/ML IV BOLUS
INTRAVENOUS | Status: DC | PRN
  Administered 2019-12-25: 80 mg via INTRAVENOUS

## 2019-12-25 MED ORDER — LIDOCAINE HCL (CARDIAC) PF 100 MG/5ML IV SOSY
PREFILLED_SYRINGE | INTRAVENOUS | Status: DC | PRN
  Administered 2019-12-25: 30 mg via INTRAVENOUS

## 2019-12-25 NOTE — Anesthesia Preprocedure Evaluation (Signed)
Anesthesia Evaluation  Patient identified by MRN, date of birth, ID band Patient awake    Reviewed: Allergy & Precautions, NPO status , Patient's Chart, lab work & pertinent test results  History of Anesthesia Complications Negative for: history of anesthetic complications  Airway Mallampati: III  TM Distance: >3 FB Neck ROM: Full    Dental no notable dental hx.    Pulmonary neg pulmonary ROS, neg sleep apnea, neg COPD,    breath sounds clear to auscultation- rhonchi (-) wheezing      Cardiovascular hypertension, Pt. on medications (-) CAD, (-) Past MI, (-) Cardiac Stents and (-) CABG  Rhythm:Regular Rate:Normal - Systolic murmurs and - Diastolic murmurs    Neuro/Psych neg Seizures negative neurological ROS  negative psych ROS   GI/Hepatic Neg liver ROS, hiatal hernia, GERD  ,  Endo/Other  diabetes, Oral Hypoglycemic Agents  Renal/GU CRFRenal disease     Musculoskeletal negative musculoskeletal ROS (+)   Abdominal (+) + obese,   Peds  Hematology negative hematology ROS (+)   Anesthesia Other Findings Past Medical History: 2014: Breast cancer ([place])     Comment:  RT LUMPECTOMY No date: Cough     Comment:  CHRONIC No date: Diabetes mellitus without complication (H[facility])     Comment:  no meds since 2012 No date: Diverticulosis No date: Dizziness     Comment:  MEDICINE RELATED No date: Dysrhythmia No date: GERD (gastroesophageal reflux disease) No date: Heart palpitations No date: Hepatic steatosis No date: High cholesterol No date: History of hiatal hernia No date: History of partial mastectomy of right breast No date: Hypertension No date: IBS (irritable bowel syndrome) No date: Liver mass No date: Motion sickness     Comment:  cars No date: Neoplasm of kidney No date: 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[place]) 2014: Personal history of radiation therapy     Comment:  BREAST CA 2014: Radiation     Comment:  BREAST  CA 5005: Renal cell carcinoma (H[facility]) No date: Renal insufficiency   Reproductive/Obstetrics                             Anesthesia Physical Anesthesia Plan  ASA: III  Anesthesia Plan: General   Post-op Pain Management:    Induction: Intravenous  PONV Risk Score and Plan: 2 and Propofol infusion  Airway Management Planned: Natural Airway  Additional Equipment:   Intra-op Plan:   Post-operative Plan:   Informed Consent: I have reviewed the patients History and Physical, chart, labs and discussed the procedure including the risks, benefits and alternatives for the proposed anesthesia with the patient or authorized representative who has indicated his/her understanding and acceptance.     Dental advisory given  Plan Discussed with: CRNA and Anesthesiologist  Anesthesia Plan Comments:         Anesthesia Quick Evaluation

## 2019-12-25 NOTE — Anesthesia Postprocedure Evaluation (Signed)
Anesthesia Post Note  Patient: Gail Mccarthy  Procedure(s) Performed: COLONOSCOPY WITH PROPOFOL (N/A )  Patient location during evaluation: Endoscopy Anesthesia Type: General Level of consciousness: awake and alert and oriented Pain management: pain level controlled Vital Signs Assessment: post-procedure vital signs reviewed and stable Respiratory status: spontaneous breathing, nonlabored ventilation and respiratory function stable Cardiovascular status: blood pressure returned to baseline and stable Postop Assessment: no signs of nausea or vomiting Anesthetic complications: no   No complications documented.   Last Vitals:  Vitals:   12/25/19 1020 12/25/19 1030  BP: 118/74 117/63  Pulse: (!) 48 (!) 49  Resp: 19 (!) 23  Temp:    SpO2: 98% 98%    Last Pain:  Vitals:   12/25/19 0950  TempSrc: Temporal  PainSc:                  Azaiah Licciardi

## 2019-12-25 NOTE — H&P (Signed)
Gail Mccarthy [id number] 1940-11-01     HPI: 79 year old woman who had a single tubular adenoma identified in the cecum at the time of her October 08, 2014 endoscopy.  For follow-up exam.  She reports tolerating the prep well.  Medications Prior to Admission  Medication Sig Dispense Refill Last Dose  . aspirin 81 MG tablet Take 81 mg by mouth daily.   Past Week at Unknown time  . benazepril (LOTENSIN) 10 MG tablet Take 10 mg by mouth daily.   12/24/2019 at Unknown time  . Cholecalciferol (VITAMIN D) 2000 units tablet Take 2,000 Units by mouth daily.   Past Week at Unknown time  . glipizide-metformin (METAGLIP) 2.5-250 MG tablet Take 1 tablet by mouth daily.   12/23/19  . Multiple Vitamins-Minerals (PRESERVISION AREDS) TABS Take 60 mg by mouth daily.   Past Week at Unknown time  . sucralfate (CARAFATE) 1 G tablet Take 1 g by mouth 4 (four) times daily -  with meals and at bedtime.   Past Month at Unknown time  . triamterene-hydrochlorothiazide (MAXZIDE-25) 37.5-25 MG per tablet Take 1 tablet by mouth daily.   12/24/2019 at Unknown time  . acetaminophen (TYLENOL) 500 MG tablet Take 1,000 mg by mouth every 6 (six) hours as needed.     Marland Kitchen amLODipine (NORVASC) 2.5 MG tablet   5 12/23/2019  . calcitRIOL (ROCALTROL) 0.25 MCG capsule Take 0.25 mcg by mouth daily.   12/23/19  . hydroxychloroquine (PLAQUENIL) 200 MG tablet Take 200 mg by mouth daily.  (Patient not taking: Reported on 12/25/2019)   Not Taking at Unknown time  . pantoprazole (PROTONIX) 40 MG tablet Take 40 mg by mouth daily.   12/23/19  . tamoxifen (NOLVADEX) 20 MG tablet Take 1 tablet (20 mg total) by mouth daily. (Patient not taking: Reported on 12/25/2019) 90 tablet 0 Completed Course at Unknown time  . traMADol (ULTRAM) 50 MG tablet Take by mouth. (Patient not taking: Reported on 12/25/2019)   Completed Course at Unknown time   Allergies  Allergen Reactions  . Amoxicillin-Pot Clavulanate Nausea Only  . Cefdinir Nausea And Vomiting  .  Codeine Nausea Only  . Clindamycin Nausea And Vomiting   Past Medical History:  Diagnosis Date  . Breast cancer ([place]) 2014   RT LUMPECTOMY  . Cough    CHRONIC  . Diabetes mellitus without complication ([place])    no meds since 2012  . Diverticulosis   . Dizziness    MEDICINE RELATED  . Dysrhythmia   . GERD (gastroesophageal reflux disease)   . Heart palpitations   . Hepatic steatosis   . High cholesterol   . History of hiatal hernia   . History of partial mastectomy of right breast   . Hypertension   . IBS (irritable bowel syndrome)   . Liver mass   . Motion sickness    cars  . Neoplasm of kidney   . 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 Personal history of radiation therapy 2014   BREAST CA  . Radiation 2014   BREAST CA  . Renal cell carcinoma ([place]) 5005  . Renal insufficiency    Past Surgical History:  Procedure Laterality Date  . APPENDECTOMY    . BREAST EXCISIONAL BIOPSY Right 08/20/2012   hx of breast ca neg but close .48mm margins radation no chemo   . BREAST LUMPECTOMY Right 2014   BREAST CA  . CATARACT EXTRACTION W/PHACO Right 10/27/2014   Procedure: CATARACT EXTRACTION PHACO AND INTRAOCULAR LENS PLACEMENT (IOC);  Surgeon: Estill Cotta, MD;  Location: [facility] ORS;  Service: Ophthalmology;  Laterality: Right;  [place]: 01:30.0 AP%: 25.7 CDE: 40.11 Fluid lot# [id number]  . CATARACT EXTRACTION W/PHACO Left 10/31/2017   Procedure: CATARACT EXTRACTION PHACO AND INTRAOCULAR LENS PLACEMENT ([place]) LEFT;  Surgeon: Leandrew Koyanagi, MD;  Location: [place];  Service: Ophthalmology;  Laterality: Left;  . CHOLECYSTECTOMY    . COLONOSCOPY WITH PROPOFOL N/A 10/08/2014   Procedure: COLONOSCOPY WITH PROPOFOL;  Surgeon: Manya Silvas, MD;  [facility] ENDOSCOPY;  Service: Endoscopy;  Laterality: N/A;  . ESOPHAGOGASTRODUODENOSCOPY (EGD) WITH PROPOFOL N/A 10/08/2014   Procedure: ESOPHAGOGASTRODUODENOSCOPY (EGD) WITH PROPOFOL;  Surgeon: Manya Silvas, MD;  Location: [facility]  ENDOSCOPY;  Service: Endoscopy;  Laterality: N/A;  . EYE SURGERY     macular hole repair  . LEG SURGERY Right    FX PINS,SCREWS  . NEPHRECTOMT Right 06/2003  . TONSILLECTOMY     Social History   Socioeconomic History  . Marital status: Married    Spouse name: Not on file  . Number of children: Not on file  . Years of education: Not on file  . Highest education level: Not on file  Occupational History  . Not on file  Tobacco Use  . Smoking status: Never Smoker  . Smokeless tobacco: Never Used  Vaping Use  . Vaping Use: Never used  Substance and Sexual Activity  . Alcohol use: No  . Drug use: Never  . Sexual activity: Not on file  Other Topics Concern  . Not on file  Social History Narrative  . Not on file   Social Determinants of Health   Financial Resource Strain:   . Difficulty of Paying Living Expenses: Not on file  Food Insecurity:   . Worried About Charity fundraiser in the Last Year: Not on file  . Ran Out of Food in the Last Year: Not on file  Transportation Needs:   . Lack of Transportation (Medical): Not on file  . Lack of Transportation (Non-Medical): Not on file  Physical Activity:   . Days of Exercise per Week: Not on file  . Minutes of Exercise per Session: Not on file  Stress:   . Feeling of Stress : Not on file  Social Connections:   . Frequency of Communication with Friends and Family: Not on file  . Frequency of Social Gatherings with Friends and Family: Not on file  . Attends Religious Services: Not on file  . Active Member of Clubs or Organizations: Not on file  . Attends Archivist Meetings: Not on file  . Marital Status: Not on file  Intimate Partner Violence:   . Fear of Current or Ex-Partner: Not on file  . Emotionally Abused: Not on file  . Physically Abused: Not on file  . Sexually Abused: Not on file   Social History   Social History Narrative  . Not on file     ROS: Negative.     PE: HEENT: Negative. Lungs:  Clear. Cardio: RR.   Assessment/Plan:  Proceed with planned endoscopy.  Gail Mccarthy 12/25/2019

## 2019-12-25 NOTE — Transfer of Care (Signed)
Immediate Anesthesia Transfer of Care Note  Patient: Gail Mccarthy  Procedure(s) Performed: Procedure(s): COLONOSCOPY WITH PROPOFOL (N/A)  Patient Location: PACU and Endoscopy Unit  Anesthesia Type:General  Level of Consciousness: sedated  Airway & Oxygen Therapy: Patient Spontanous Breathing and Patient connected to nasal cannula oxygen  Post-op Assessment: Report given to RN and Post -op Vital signs reviewed and stable  Post vital signs: Reviewed and stable  Last Vitals:  Vitals:   12/25/19 0950 12/25/19 0955  BP: (!) 89/49 (!) 89/49  Pulse: 62 62  Resp: 13 13  Temp: (!) 36.2 C   SpO2: 15% 95%    Complications: No apparent anesthesia complications

## 2019-12-25 NOTE — Op Note (Signed)
[facility] Gastroenterology Patient Name: Gail Mccarthy Procedure Date: 12/25/2019 9:07 AM MRN: [id number] Account #: [id number] Date of Birth: 1940-08-04 Admit Type: Outpatient Age: 79 Room: [facility] ENDO ROOM 1 Gender: Female Note Status: Finalized Procedure:             Colonoscopy Indications:           High risk colon cancer surveillance: Personal history                         of colonic polyps Providers:             Robert Bellow, MD Referring MD:          Andres Labrum, MD (Referring MD) Medicines:             Monitored Anesthesia Care Complications:         No immediate complications. Procedure:             Pre-Anesthesia Assessment:                        - Prior to the procedure, a History and Physical was                         performed, and patient medications, allergies and                         sensitivities were reviewed. The patient's tolerance                         of previous anesthesia was reviewed.                        - The risks and benefits of the procedure and the                         sedation options and risks were discussed with the                         patient. All questions were answered and informed                         consent was obtained.                        After obtaining informed consent, the colonoscope was                         passed under direct vision. Throughout the procedure,                         the patient's blood pressure, pulse, and oxygen                         saturations were monitored continuously. The                         Colonoscope was introduced through the anus and                         advanced to the the cecum,  identified by appendiceal                         orifice and ileocecal valve. The colonoscopy was                         performed without difficulty. The patient tolerated                         the procedure well. The quality of the bowel                          preparation was excellent. Findings:      A few medium-mouthed diverticula were found in the sigmoid colon and       ascending colon.      The retroflexed view of the distal rectum and anal verge was normal and       showed no anal or rectal abnormalities.      There was a medium-sized lipoma, [Diameter] in the ascending colon. Impression:            - Diverticulosis in the sigmoid colon and in the                         ascending colon.                        - The distal rectum and anal verge are normal on                         retroflexion view.                        - No specimens collected. Recommendation:        - Repeat colonoscopy in 5 years for surveillance. Procedure Code(s):     --- Professional ---                        [phone], Colonoscopy, flexible; diagnostic, including                         collection of specimen(s) by brushing or washing, when                         performed (separate procedure) Diagnosis Code(s):     --- Professional ---                        Z86.010, Personal history of colonic polyps                        K57.30, Diverticulosis of large intestine without                         perforation or abscess without bleeding CPT copyright 2019 American Medical Association. All rights reserved. The codes documented in this report are preliminary and upon coder review may  be revised to meet current compliance requirements. Robert Bellow, MD 12/25/2019 9:49:46 AM This report has been signed electronically. Number of Addenda: 0 Note Initiated On: 12/25/2019 9:07 AM Scope Withdrawal Time: 0 hours 13 minutes 9 seconds  Total Procedure  Duration: 0 hours 19 minutes 6 seconds  Estimated Blood Loss:  Estimated blood loss: none.      [facility]

## 2019-12-26 ENCOUNTER — Encounter: Payer: Self-pay | Admitting: General Surgery

## 2020-05-19 ENCOUNTER — Other Ambulatory Visit: Payer: Self-pay | Admitting: Internal Medicine

## 2020-05-19 DIAGNOSIS — Z1231 Encounter for screening mammogram for malignant neoplasm of breast: Secondary | ICD-10-CM

## 2020-09-14 ENCOUNTER — Ambulatory Visit
Admission: RE | Admit: 2020-09-14 | Discharge: 2020-09-14 | Disposition: A | Discharge status: Home / Self Care | Payer: Medicare Other | Source: Ambulatory Visit | Attending: Internal Medicine | Admitting: Internal Medicine

## 2020-09-14 ENCOUNTER — Other Ambulatory Visit: Payer: Self-pay

## 2020-09-14 DIAGNOSIS — Z1231 Encounter for screening mammogram for malignant neoplasm of breast: Secondary | ICD-10-CM | POA: Diagnosis present

## 2021-08-13 ENCOUNTER — Other Ambulatory Visit: Payer: Self-pay | Admitting: Obstetrics and Gynecology

## 2021-08-13 DIAGNOSIS — Z1231 Encounter for screening mammogram for malignant neoplasm of breast: Secondary | ICD-10-CM

## 2021-09-15 ENCOUNTER — Ambulatory Visit
Admission: RE | Admit: 2021-09-15 | Discharge: 2021-09-15 | Disposition: A | Discharge status: Home / Self Care | Payer: Medicare Other | Source: Ambulatory Visit | Attending: Obstetrics and Gynecology | Admitting: Obstetrics and Gynecology

## 2021-09-15 DIAGNOSIS — Z1231 Encounter for screening mammogram for malignant neoplasm of breast: Secondary | ICD-10-CM | POA: Diagnosis present

## 2022-08-16 ENCOUNTER — Other Ambulatory Visit: Payer: Self-pay

## 2022-08-16 DIAGNOSIS — Z1231 Encounter for screening mammogram for malignant neoplasm of breast: Secondary | ICD-10-CM

## 2022-09-19 ENCOUNTER — Ambulatory Visit
Admission: RE | Admit: 2022-09-19 | Discharge: 2022-09-19 | Disposition: A | Discharge status: Home / Self Care | Payer: Medicare Other | Source: Ambulatory Visit | Attending: Internal Medicine | Admitting: Internal Medicine

## 2022-09-19 DIAGNOSIS — Z1231 Encounter for screening mammogram for malignant neoplasm of breast: Secondary | ICD-10-CM | POA: Insufficient documentation

## 2022-10-24 ENCOUNTER — Other Ambulatory Visit: Payer: Self-pay | Admitting: Podiatry

## 2022-10-25 ENCOUNTER — Encounter: Payer: Self-pay | Admitting: Podiatry

## 2022-10-26 NOTE — Discharge Instructions (Signed)
[facility]  POST OPERATIVE INSTRUCTIONS FOR DR. FOWLER AND [facility] PODIATRY DEPARTMENT   Take your medication as prescribed.  Pain medication should be taken only as needed.  Keep the dressing clean, dry and intact.  Keep your foot elevated above the heart level for the first 48 hours.  Walking to the bathroom and brief periods of walking are acceptable, unless we have instructed you to be non-weight bearing.  Always wear your post-op shoe when walking.  Always use your crutches if you are to be non-weight bearing.  Do not take a shower. Baths are permissible as long as the foot is kept out of the water.   Every hour you are awake:  Bend your knee 15 times. Flex foot 15 times Massage calf 15 times  Call [facility] ([phone]) if any of the following problems occur: You develop a temperature or fever. The bandage becomes saturated with blood. Medication does not stop your pain. Injury of the foot occurs. Any symptoms of infection including redness, odor, or red streaks running from wound. 

## 2022-10-27 ENCOUNTER — Other Ambulatory Visit: Payer: Self-pay

## 2022-10-27 ENCOUNTER — Ambulatory Visit: Payer: Medicare Other | Admitting: Anesthesiology

## 2022-10-27 ENCOUNTER — Ambulatory Visit
Admission: RE | Admit: 2022-10-27 | Discharge: 2022-10-27 | Disposition: A | Discharge status: Home / Self Care | Payer: Medicare Other | Attending: Podiatry | Admitting: Podiatry

## 2022-10-27 ENCOUNTER — Encounter: Payer: Self-pay | Admitting: Podiatry

## 2022-10-27 ENCOUNTER — Encounter
Admission: RE | Disposition: A | Discharge status: Home / Self Care | Payer: Self-pay | Source: Home / Self Care | Attending: Podiatry

## 2022-10-27 ENCOUNTER — Ambulatory Visit: Payer: Self-pay

## 2022-10-27 DIAGNOSIS — I471 Supraventricular tachycardia, unspecified: Secondary | ICD-10-CM | POA: Insufficient documentation

## 2022-10-27 DIAGNOSIS — X501XXA Overexertion from prolonged static or awkward postures, initial encounter: Secondary | ICD-10-CM | POA: Diagnosis not present

## 2022-10-27 DIAGNOSIS — I129 Hypertensive chronic kidney disease with stage 1 through stage 4 chronic kidney disease, or unspecified chronic kidney disease: Secondary | ICD-10-CM | POA: Insufficient documentation

## 2022-10-27 DIAGNOSIS — K449 Diaphragmatic hernia without obstruction or gangrene: Secondary | ICD-10-CM | POA: Insufficient documentation

## 2022-10-27 DIAGNOSIS — S92351A Displaced fracture of fifth metatarsal bone, right foot, initial encounter for closed fracture: Secondary | ICD-10-CM | POA: Insufficient documentation

## 2022-10-27 DIAGNOSIS — E1122 Type 2 diabetes mellitus with diabetic chronic kidney disease: Secondary | ICD-10-CM | POA: Insufficient documentation

## 2022-10-27 DIAGNOSIS — K219 Gastro-esophageal reflux disease without esophagitis: Secondary | ICD-10-CM | POA: Diagnosis not present

## 2022-10-27 DIAGNOSIS — N183 Chronic kidney disease, stage 3 unspecified: Secondary | ICD-10-CM | POA: Diagnosis not present

## 2022-10-27 HISTORY — PX: REPAIR EXTENSOR TENDON WITH METATARSAL OSTEOTOMY AND OPEN REDUCTION IN: SHX5698

## 2022-10-27 LAB — GLUCOSE, CAPILLARY: Glucose-Capillary: 141 mg/dL — ABNORMAL HIGH (ref 70–99)

## 2022-10-27 SURGERY — REPAIR EXTENSOR TENDON WITH METATARSAL OSTEOTOMY AND OPEN REDUCTION INTERNAL FIXATION (ORIF) METATARSAL
Anesthesia: General | Laterality: Right

## 2022-10-27 MED ORDER — LACTATED RINGERS IV SOLN
INTRAVENOUS | Status: DC | PRN
Start: 2022-10-27 — End: 2022-10-27

## 2022-10-27 MED ORDER — DEXAMETHASONE SODIUM PHOSPHATE 4 MG/ML IJ SOLN
INTRAMUSCULAR | Status: DC | PRN
  Administered 2022-10-27: 8 mg via INTRAVENOUS

## 2022-10-27 MED ORDER — PHENYLEPHRINE HCL (PRESSORS) 10 MG/ML IV SOLN
INTRAVENOUS | Status: DC | PRN
  Administered 2022-10-27 (×3): 50 ug via INTRAVENOUS
  Administered 2022-10-27: 100 ug via INTRAVENOUS

## 2022-10-27 MED ORDER — LIDOCAINE HCL (CARDIAC) PF 100 MG/5ML IV SOSY
PREFILLED_SYRINGE | INTRAVENOUS | Status: DC | PRN
  Administered 2022-10-27: 80 mg via INTRATRACHEAL

## 2022-10-27 MED ORDER — PROPOFOL 10 MG/ML IV BOLUS
INTRAVENOUS | Status: DC | PRN
Start: 2022-10-27 — End: 2022-10-27
  Administered 2022-10-27: 200 mg via INTRAVENOUS

## 2022-10-27 MED ORDER — 0.9 % SODIUM CHLORIDE (POUR BTL) OPTIME
TOPICAL | Status: DC | PRN
  Administered 2022-10-27: 1000 mL

## 2022-10-27 MED ORDER — VANCOMYCIN HCL IN DEXTROSE 1-5 GM/200ML-% IV SOLN
1000.0000 mg | INTRAVENOUS | Status: AC
  Administered 2022-10-27: 1000 mg via INTRAVENOUS

## 2022-10-27 MED ORDER — BUPIVACAINE LIPOSOME 1.3 % IJ SUSP
INTRAMUSCULAR | Status: DC | PRN
  Administered 2022-10-27: 10 mL

## 2022-10-27 MED ORDER — BUPIVACAINE HCL (PF) 0.25 % IJ SOLN
INTRAMUSCULAR | Status: DC | PRN
  Administered 2022-10-27: 10 mL

## 2022-10-27 MED ORDER — MIDAZOLAM HCL 5 MG/5ML IJ SOLN
INTRAMUSCULAR | Status: DC | PRN
  Administered 2022-10-27: 1 mg via INTRAVENOUS

## 2022-10-27 MED ORDER — FENTANYL CITRATE (PF) 100 MCG/2ML IJ SOLN
INTRAMUSCULAR | Status: DC | PRN
  Administered 2022-10-27: 50 ug via INTRAVENOUS

## 2022-10-27 MED ORDER — GLYCOPYRROLATE 0.2 MG/ML IJ SOLN
INTRAMUSCULAR | Status: DC | PRN
Start: 2022-10-27 — End: 2022-10-27
  Administered 2022-10-27: .2 mg via INTRAVENOUS

## 2022-10-27 MED ORDER — ONDANSETRON HCL 4 MG/2ML IJ SOLN
INTRAMUSCULAR | Status: DC | PRN
Start: 2022-10-27 — End: 2022-10-27
  Administered 2022-10-27: 4 mg via INTRAVENOUS

## 2022-10-27 MED ORDER — EPHEDRINE SULFATE (PRESSORS) 50 MG/ML IJ SOLN
INTRAMUSCULAR | Status: DC | PRN
Start: 2022-10-27 — End: 2022-10-27
  Administered 2022-10-27 (×3): 10 mg via INTRAVENOUS

## 2022-10-27 MED ORDER — METOPROLOL TARTRATE 5 MG/5ML IV SOLN
INTRAVENOUS | Status: DC | PRN
  Administered 2022-10-27: 1 mg via INTRAVENOUS

## 2022-10-27 MED ORDER — HYDROCODONE-ACETAMINOPHEN 5-325 MG PO TABS: 1.0000 | ORAL_TABLET | Freq: Four times a day (QID) | ORAL | 0 refills | Status: AC | PRN

## 2022-10-27 MED ORDER — ASPIRIN 81 MG PO TABS: 81.0000 mg | ORAL_TABLET | Freq: Two times a day (BID) | ORAL | Status: AC

## 2022-10-27 MED ORDER — ONDANSETRON HCL 4 MG PO TABS: 4.0000 mg | ORAL_TABLET | Freq: Three times a day (TID) | ORAL | 0 refills | Status: DC | PRN

## 2022-10-27 MED ORDER — LEVOFLOXACIN IN D5W 500 MG/100ML IV SOLN: 500.0000 mg | INTRAVENOUS | Status: DC

## 2022-10-27 SURGICAL SUPPLY — 42 items
BIT DRILL CANN 3.2X180 (DRILL) IMPLANT
BNDG CMPR STD VLCR NS LF 5.8X4 (GAUZE/BANDAGES/DRESSINGS) ×1
BNDG CMPR STD VLCR NS LF 5.8X6 (GAUZE/BANDAGES/DRESSINGS) ×1
BNDG ELASTIC 4X5.8 VLCR NS LF (GAUZE/BANDAGES/DRESSINGS) ×1 IMPLANT
BNDG ELASTIC 6X5.8 VLCR NS LF (GAUZE/BANDAGES/DRESSINGS) ×1 IMPLANT
BNDG ESMARK 4X12 TAN STRL LF (GAUZE/BANDAGES/DRESSINGS) ×1 IMPLANT
BNDG GAUZE DERMACEA FLUFF 4 (GAUZE/BANDAGES/DRESSINGS) ×1 IMPLANT
BNDG GZE DERMACEA 4 6PLY (GAUZE/BANDAGES/DRESSINGS) ×1
CANISTER SUCT 1200ML W/VALVE (MISCELLANEOUS) ×1 IMPLANT
COVER LIGHT HANDLE UNIVERSAL (MISCELLANEOUS) ×2 IMPLANT
CUFF TOURN SGL QUICK 30 (TOURNIQUET CUFF) ×1
CUFF TRNQT CYL 30X4X21-28X (TOURNIQUET CUFF) ×1 IMPLANT
DRILL CANN 3.2X180 (DRILL) ×1
DURAPREP 26ML APPLICATOR (WOUND CARE) ×2 IMPLANT
ELECT REM PT RETURN 9FT ADLT (ELECTROSURGICAL) ×1
ELECTRODE REM PT RTRN 9FT ADLT (ELECTROSURGICAL) ×1 IMPLANT
GAUZE 4X4 16PLY ~~LOC~~+RFID DBL (SPONGE) ×1 IMPLANT
GAUZE SPONGE 4X4 12PLY STRL (GAUZE/BANDAGES/DRESSINGS) ×1 IMPLANT
GAUZE XEROFORM 1X8 LF (GAUZE/BANDAGES/DRESSINGS) ×1 IMPLANT
GLOVE BIOGEL PI IND STRL 7.5 (GLOVE) ×1 IMPLANT
GLOVE SURG SS PI 7.0 STRL IVOR (GLOVE) ×1 IMPLANT
GOWN STRL REUS W/ TWL LRG LVL3 (GOWN DISPOSABLE) ×2 IMPLANT
GOWN STRL REUS W/TWL LRG LVL3 (GOWN DISPOSABLE) ×2
KIT TURNOVER KIT A (KITS) ×1 IMPLANT
NS IRRIG 500ML POUR BTL (IV SOLUTION) ×1 IMPLANT
PADDING CAST BLEND 4X4 NS (MISCELLANEOUS) ×3 IMPLANT
SCREW 4.5 X 56 (Screw) IMPLANT
SPLINT CAST 1 STEP 4X30 (MISCELLANEOUS) ×1 IMPLANT
SPONGE T-LAP 18X18 ~~LOC~~+RFID (SPONGE) ×1 IMPLANT
STIMULATOR BONE GROWTH EMG EXT (ORTHOPEDIC SUPPLIES) IMPLANT
STOCKINETTE ORTHO 6X25 (MISCELLANEOUS) ×1 IMPLANT
SUT ETHILON 3-0 FS-10 30 BLK (SUTURE) ×1
SUT ETHILON 4-0 (SUTURE) ×1
SUT ETHILON 4-0 FS2 18XMFL BLK (SUTURE) ×1
SUT MNCRL 4-0 (SUTURE) ×1
SUT MNCRL 4-0 27XMFL (SUTURE) ×1
SUT VIC AB 4-0 FS2 27 (SUTURE) ×1 IMPLANT
SUTURE EHLN 3-0 FS-10 30 BLK (SUTURE) IMPLANT
SUTURE ETHLN 4-0 FS2 18XMF BLK (SUTURE) ×1 IMPLANT
SUTURE MNCRL 4-0 27XMF (SUTURE) ×1 IMPLANT
TAP 4.5MM CANNULATED (TAP) IMPLANT
WIRE 1.5 NITINOL (MISCELLANEOUS) IMPLANT

## 2022-10-27 NOTE — Anesthesia Procedure Notes (Signed)
Procedure Name: LMA Insertion Date/Time: 10/27/2022 10:03 AM  Performed by: Barbette Hair, CRNAPre-anesthesia Checklist: Patient identified, Emergency Drugs available, Suction available, Patient being monitored and Timeout performed Patient Re-evaluated:Patient Re-evaluated prior to induction Oxygen Delivery Method: Circle system utilized Preoxygenation: Pre-oxygenation with 100% oxygen Induction Type: IV induction LMA: LMA inserted LMA Size: 5.0 Number of attempts: 1 Placement Confirmation: positive ETCO2 and CO2 detector Tube secured with: Tape Dental Injury: Teeth and Oropharynx as per pre-operative assessment

## 2022-10-27 NOTE — Anesthesia Preprocedure Evaluation (Signed)
Anesthesia Evaluation  Patient identified by MRN, date of birth, ID band Patient awake    Reviewed: Allergy & Precautions, NPO status , Patient's Chart, lab work & pertinent test results  History of Anesthesia Complications Negative for: history of anesthetic complications  Airway Mallampati: III  TM Distance: >3 FB Neck ROM: full    Dental no notable dental hx.    Pulmonary neg pulmonary ROS   Pulmonary exam normal        Cardiovascular hypertension, On Medications Normal cardiovascular exam+ dysrhythmias (RBBB)      Neuro/Psych negative neurological ROS     GI/Hepatic Neg liver ROS, hiatal hernia,GERD  Controlled,,  Endo/Other  negative endocrine ROSdiabetes    Renal/GU Renal disease     Musculoskeletal   Abdominal   Peds  Hematology negative hematology ROS (+)   Anesthesia Other Findings Past Medical History: 2014: Breast cancer (HCC)     Comment:  RT LUMPECTOMY No date: Cough     Comment:  CHRONIC No date: Diabetes mellitus without complication (HCC)     Comment:  no meds since 2012 No date: Diverticulosis No date: Dizziness     Comment:  MEDICINE RELATED No date: Dysrhythmia No date: GERD (gastroesophageal reflux disease) No date: Heart palpitations No date: Hepatic steatosis No date: High cholesterol No date: History of hiatal hernia No date: History of partial mastectomy of right breast No date: Hypertension No date: IBS (irritable bowel syndrome) No date: Liver mass No date: Motion sickness     Comment:  cars No date: Neoplasm of kidney 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) 2014: Personal history of radiation therapy     Comment:  BREAST CA 2014: Radiation     Comment:  BREAST CA 5005: Renal cell carcinoma (HCC) No date: Renal insufficiency  Past Surgical History: No date: APPENDECTOMY 08/20/2012: BREAST EXCISIONAL BIOPSY; Right     Comment:  hx of breast ca but close .25mm margins  radation no               chemo 2014: BREAST LUMPECTOMY; Right     Comment:  BREAST CA 10/27/2014: CATARACT EXTRACTION W/PHACO; Right     Comment:  Procedure: CATARACT EXTRACTION PHACO AND INTRAOCULAR               LENS PLACEMENT (IOC);  Surgeon: Sallee Lange, MD;                Location: [facility];  Service: Ophthalmology;  Laterality:              Right;  [place]: 01:30.0 AP%: 25.7 CDE: 40.11 Fluid lot#               [id number] 10/31/2017: CATARACT EXTRACTION W/PHACO; Left     Comment:  Procedure: CATARACT EXTRACTION PHACO AND INTRAOCULAR               LENS PLACEMENT (IOC) LEFT;  Surgeon: Lockie Mola, MD;  Location: [facility];  Service:               Ophthalmology;  Laterality: Left; No date: CHOLECYSTECTOMY 10/08/2014: COLONOSCOPY WITH PROPOFOL; N/A     Comment:  Procedure: COLONOSCOPY WITH PROPOFOL;  Surgeon: Scot Jun, MD;  Location: [facility] ENDOSCOPY;  Service:               Endoscopy;  Laterality:  N/A; 12/25/2019: COLONOSCOPY WITH PROPOFOL; N/A     Comment:  Procedure: COLONOSCOPY WITH PROPOFOL;  Surgeon: Earline Mayotte, MD;  Location: [facility] ENDOSCOPY;  Service:               Endoscopy;  Laterality: N/A; 10/08/2014: ESOPHAGOGASTRODUODENOSCOPY (EGD) WITH PROPOFOL; N/A     Comment:  Procedure: ESOPHAGOGASTRODUODENOSCOPY (EGD) WITH               PROPOFOL;  Surgeon: Scot Jun, MD;  Location: [facility]              ENDOSCOPY;  Service: Endoscopy;  Laterality: N/A; No date: EYE SURGERY     Comment:  macular hole repair No date: LEG SURGERY; Right     Comment:  FX PINS,SCREWS 06/2003: NEPHRECTOMT; Right No date: TONSILLECTOMY  BMI    Body Mass Index: 35.11 kg/m      Reproductive/Obstetrics negative OB ROS                             Anesthesia Physical Anesthesia Plan  ASA: 2  Anesthesia Plan: General LMA   Post-op Pain Management: Regional block   Induction:  Intravenous  PONV Risk Score and Plan: 3 and Dexamethasone, Ondansetron and Treatment may vary due to age or medical condition  Airway Management Planned: LMA  Additional Equipment:   Intra-op Plan:   Post-operative Plan: Extubation in OR  Informed Consent: I have reviewed the patients History and Physical, chart, labs and discussed the procedure including the risks, benefits and alternatives for the proposed anesthesia with the patient or authorized representative who has indicated his/her understanding and acceptance.     Dental Advisory Given  Plan Discussed with: Anesthesiologist, CRNA and Surgeon  Anesthesia Plan Comments: (Patient consented for risks of anesthesia including but not limited to:  - adverse reactions to medications - damage to eyes, teeth, lips or other oral mucosa - nerve damage due to positioning  - sore throat or hoarseness - Damage to heart, brain, nerves, lungs, other parts of body or loss of life  Patient voiced understanding.)       Anesthesia Quick Evaluation

## 2022-10-27 NOTE — Progress Notes (Signed)
Patient has Levaquin ordered IV abx. Vancomycin is also ordered. Notified Dr. Excell Seltzer that we do not have Levaquin in house, per Dr. Excell Seltzer is okay do do without the Levaquin, and just vancomycin is okay.

## 2022-10-27 NOTE — Op Note (Signed)
PODIATRY / FOOT AND ANKLE SURGERY OPERATIVE REPORT    SURGEON: Rosetta Posner, DPM  PRE-OPERATIVE DIAGNOSIS:  1.  Jones fracture right fifth metatarsal base  POST-OPERATIVE DIAGNOSIS: Same Patient did have some supraventricular tachycardia during procedure.  For more detail look for anesthesia documentation.  PROCEDURE(S): Right fifth metatarsal Jones fracture open reduction with internal fixation  HEMOSTASIS: Right ankle tourniquet  ANESTHESIA: general  ESTIMATED BLOOD LOSS: 5 cc  FINDING(S): 1.  Right fifth metatarsal mildly displaced Jones fracture 2.  During case patient did have some supraventricular tachycardia requiring attention from anesthesia.  For more detailed look at their notes.  PATHOLOGY/SPECIMEN(S): None  INDICATIONS:   Gail Mccarthy is a 82 y.o. female who presents with a acute injury to the right foot in which patient twisted her foot and felt a pop.  Patient had x-ray imaging taken which showed a mildly displaced Jones fracture to the fifth metatarsal base.  All treatment options were discussed with the patient both conservative and surgical attempts at correction include potential risks and complications.  At this time patient is elected for surgical intervention consisting of right foot Jones fracture open reduction with internal fixation.  Patient has been cleared for surgery by primary care physician.  Consent obtained, no urine she is given..  DESCRIPTION: After obtaining full informed written consent, the patient was brought back to the operating room and placed supine upon the operating table.  The patient received IV antibiotics prior to induction.  After obtaining adequate anesthesia, the patient was prepped and draped in the standard fashion.  10 cc of half percent Marcaine plain was injected about the lateral right foot near the fifth metatarsal base and calcaneocuboid joint area for a preoperative block.  The Esmarch bandage used to exsanguinate the  right lower extremity and the pneumatic ankle tourniquet was inflated.  Attention was directed to the right fifth metatarsal base were under fluoroscopic guidance a wire was placed into the fifth metatarsal base through the proximal shaft and into the mid shaft.  This was checked under multiple planes of view of imaging in the AP, lateral oblique, and lateral views.  Once the wire appeared to be in the appropriate position a small incision was made around the wire and blunt dissection was continued down to the base of the fifth metatarsal with hemostat.  A 4.5 mm headed cannulated screw was then held under fluoroscopic guidance over the area and appeared to fit the cortex of the bone appropriately.  At this time the drill and tap was then used associated with a 4.5 mm headed cannulated screw from [employer].  Using standard AO principles and techniques a 4.5 x 56 mm partially-threaded cannulated headed screw was placed across the fracture site with excellent compression present.  Appeared to have fairly good bite overall to the fifth metatarsal base.  This appeared to reduce the fracture gap and the fracture appeared to be well opposed overall.  The guidewire was removed and final C-arm imaging was taken showing proper orientation of screw with excellent compression across the fifth metatarsal base fracture.  The surgical site was flushed with copious amounts normal sterile saline.  The skin was then reapproximated well coapted with 3-0 nylon in a combination of simple and horizontal mattress type stitching.  An additional 10 cc of Exparel was injected about the operative area.  A postoperative dressing was then applied consisting of Xeroform followed by 4 x 4 gauze, gauze roll, Ace wrap, tall cam boot.  Patient  had the pneumatic ankle tourniquet removed prior to placing a dressing prompt hyperemic response was noted all distal right foot.  The patient tolerated the procedure and anesthesia well and was  transferred to recovery room fossa stable and vascular status intact to all toes right foot.  Following.  Postoperative monitoring the patient will be discharged home with the appropriate orders, medications, instructions.  Patient is to remain nonweightbearing at all times to the right lower extremity but may place heel on the ground to rest.  Patient also given bone stimulator today and instructed on usage.  During procedure patient did have some beats of supraventricular tachycardia requiring medical attention from anesthesia.  After appropriate intervention by anesthesia patient appeared to be stabilized.  Discussed with patient and family in detail after procedure.  Patient to follow-up with primary care physician within the next week for further evaluation.  May be necessary to obtain a cardiologist in the future persistent.  COMPLICATIONS: None  CONDITION: Good, stable  Rosetta Posner, DPM

## 2022-10-27 NOTE — H&P (Signed)
HISTORY AND PHYSICAL INTERVAL NOTE:  10/27/2022  9:47 AM  Gail Mccarthy  has presented today for surgery, with the diagnosis of S92.351A - Closed displaced fracture of fifth metatarsal bone of right foot E11.9 - Diabetes mellitus without complication M79.671 - Right foot pain.  The various methods of treatment have been discussed with the patient.  No guarantees were given.  After consideration of risks, benefits and other options for treatment, the patient has consented to surgery.  I have reviewed the patients' chart and labs.    PROCEDURE: RIGHT FOOT 5TH METATARSAL FRACTURE ORIF - JONES FRACTURE  A history and physical examination was performed in my office.  The patient was reexamined.  There have been no changes to this history and physical examination.  Rosetta Posner, DPM

## 2022-10-27 NOTE — Transfer of Care (Signed)
Immediate Anesthesia Transfer of Care Note  Patient: Gail Mccarthy  Procedure(s) Performed: METATARSAL  OPEN REDUCTION INTERNAL FIXATION (ORIF) METATARSAL (Right)  Patient Location: PACU  Anesthesia Type: General LMA  Level of Consciousness: awake, alert  and patient cooperative  Airway and Oxygen Therapy: Patient Spontanous Breathing and Patient connected to supplemental oxygen  Post-op Assessment: Post-op Vital signs reviewed, Patient's Cardiovascular Status Stable, Respiratory Function Stable, Patent Airway and No signs of Nausea or vomiting  Post-op Vital Signs: Reviewed and stable  Complications: No notable events documented.

## 2022-10-27 NOTE — Anesthesia Postprocedure Evaluation (Signed)
Anesthesia Post Note  Patient: [name]  Procedure(s) Performed: METATARSAL  OPEN REDUCTION INTERNAL FIXATION (ORIF) METATARSAL (Right)  Patient location during evaluation: PACU Anesthesia Type: General Level of consciousness: awake and alert Pain management: pain level controlled Vital Signs Assessment: post-procedure vital signs reviewed and stable Respiratory status: spontaneous breathing, nonlabored ventilation, respiratory function stable and patient connected to nasal cannula oxygen Cardiovascular status: blood pressure returned to baseline and stable Postop Assessment: no apparent nausea or vomiting Anesthetic complications: no   No notable events documented.   Last Vitals:  Vitals:   10/27/22 1125 10/27/22 1130  BP: 136/62 (!) 107/42  Pulse: 64 72  Resp: (!) 24 19  Temp:    SpO2: 96% 98%    Last Pain:  Vitals:   10/27/22 1130  TempSrc:   PainSc: 2                  Gail Mccarthy

## 2022-10-28 ENCOUNTER — Encounter: Payer: Self-pay | Admitting: Podiatry

## 2022-11-03 ENCOUNTER — Encounter: Payer: Self-pay | Admitting: Emergency Medicine

## 2022-11-03 ENCOUNTER — Other Ambulatory Visit: Payer: Self-pay

## 2022-11-03 DIAGNOSIS — K5903 Drug induced constipation: Secondary | ICD-10-CM | POA: Diagnosis not present

## 2022-11-03 DIAGNOSIS — R339 Retention of urine, unspecified: Secondary | ICD-10-CM | POA: Insufficient documentation

## 2022-11-03 LAB — CBC
HCT: 42.6 % (ref 36.0–46.0)
Hemoglobin: 13.4 g/dL (ref 12.0–15.0)
MCH: 26.2 pg (ref 26.0–34.0)
MCHC: 31.5 g/dL (ref 30.0–36.0)
MCV: 83.2 fL (ref 80.0–100.0)
Platelets: 180 10*3/uL (ref 150–400)
RBC: 5.12 MIL/uL — ABNORMAL HIGH (ref 3.87–5.11)
RDW: 16.6 % — ABNORMAL HIGH (ref 11.5–15.5)
WBC: 16.4 10*3/uL — ABNORMAL HIGH (ref 4.0–10.5)
nRBC: 0 % (ref 0.0–0.2)

## 2022-11-03 LAB — COMPREHENSIVE METABOLIC PANEL
ALT: 30 U/L (ref 0–44)
AST: 15 U/L (ref 15–41)
Albumin: 3.5 g/dL (ref 3.5–5.0)
Alkaline Phosphatase: 203 U/L — ABNORMAL HIGH (ref 38–126)
Anion gap: 10 (ref 5–15)
BUN: 25 mg/dL — ABNORMAL HIGH (ref 8–23)
CO2: 19 mmol/L — ABNORMAL LOW (ref 22–32)
Calcium: 9.5 mg/dL (ref 8.9–10.3)
Chloride: 107 mmol/L (ref 98–111)
Creatinine, Ser: 1.05 mg/dL — ABNORMAL HIGH (ref 0.44–1.00)
GFR, Estimated: 54 mL/min — ABNORMAL LOW (ref >60)
Glucose, Bld: 176 mg/dL — ABNORMAL HIGH (ref 70–99)
Potassium: 3.9 mmol/L (ref 3.5–5.1)
Sodium: 136 mmol/L (ref 135–145)
Total Bilirubin: 0.7 mg/dL (ref 0.3–1.2)
Total Protein: 6.8 g/dL (ref 6.5–8.1)

## 2022-11-03 LAB — URINALYSIS, ROUTINE W REFLEX MICROSCOPIC
Bilirubin Urine: NEGATIVE
Glucose, UA: NEGATIVE mg/dL
Hgb urine dipstick: NEGATIVE
Ketones, ur: 5 mg/dL — AB
Leukocytes,Ua: NEGATIVE
Nitrite: NEGATIVE
Protein, ur: NEGATIVE mg/dL
Specific Gravity, Urine: 1.025 (ref 1.005–1.030)
pH: 5 (ref 5.0–8.0)

## 2022-11-03 NOTE — ED Triage Notes (Addendum)
Pt arrived via POV with reports of urinary retention, states she has not urinated since 0800 states she also c/o constipation, states its been over a week since she has had a bowel movement, pt states she had surgery on her foot on 8/8 and is on hydrocodone taking it sparingly, pt states she tried a laxative today hoping it would help with the urinary retention and the constipation, but pt has had no relief. Pt reports she is also having rectal pain due to hemorrhoids.   Pt states she took mag citrate at home today for constipation relief.

## 2022-11-04 ENCOUNTER — Emergency Department: Payer: Medicare Other

## 2022-11-04 ENCOUNTER — Emergency Department
Admission: EM | Admit: 2022-11-04 | Discharge: 2022-11-04 | Disposition: A | Discharge status: Home / Self Care | Payer: Medicare Other | Attending: Emergency Medicine | Admitting: Emergency Medicine

## 2022-11-04 DIAGNOSIS — K5903 Drug induced constipation: Secondary | ICD-10-CM

## 2022-11-04 DIAGNOSIS — R339 Retention of urine, unspecified: Secondary | ICD-10-CM

## 2022-11-04 MED ORDER — POLYETHYLENE GLYCOL 3350 17 G PO PACK
34.0000 g | PACK | Freq: Once | ORAL | Status: AC
  Administered 2022-11-04: 34 g via ORAL
  Filled 2022-11-04: qty 2

## 2022-11-04 MED ORDER — LACTATED RINGERS IV BOLUS
1000.0000 mL | Freq: Once | INTRAVENOUS | Status: AC
  Administered 2022-11-04: 1000 mL via INTRAVENOUS

## 2022-11-04 NOTE — ED Provider Notes (Signed)
[facility] Provider Note    Event Date/Time   First MD Initiated Contact with Patient 11/04/22 0310     (approximate)   History   Urinary Retention and Constipation   HPI  Gail Mccarthy is a 82 y.o. female who presents to the ED for evaluation of Urinary Retention and Constipation   Patient presents to the ED alongside her son for evaluation of lower abdominal pain, constipation, difficulty voiding.   She reports a week ago she had an outpatient pediatric surgery and has been prescribed opiates since then.  And has been constipated since then.  Has only passed a couple small hard balls of stool in the past 1 week.  Just in the past 1 day today she has had difficulty voiding, she had a void yesterday at 8 AM, but no significant void since then.  No fevers, dysuria   Physical Exam   Triage Vital Signs: ED Triage Vitals  Encounter Vitals Group     BP 11/03/22 2238 (!) 144/96     Systolic BP Percentile --      Diastolic BP Percentile --      Pulse Rate 11/03/22 2238 88     Resp 11/03/22 2238 18     Temp 11/03/22 2238 97.7 F (36.5 C)     Temp Source 11/03/22 2238 Oral     SpO2 11/03/22 2238 99 %     Weight 11/03/22 2236 206 lb 4.8 oz (93.6 kg)     Height 11/03/22 2236 5\' 5"  (1.651 m)     Head Circumference --      Peak Flow --      Pain Score 11/03/22 2236 10     Pain Loc --      Pain Education --      Exclude from Growth Chart --     Most recent vital signs: Vitals:   11/03/22 2238 11/04/22 0245  BP: (!) 144/96 (!) 159/70  Pulse: 88 62  Resp: 18 20  Temp: 97.7 F (36.5 C) 97.8 F (36.6 C)  SpO2: 99% 100%    General: Awake, no distress.  CV:  Good peripheral perfusion.  Resp:  Normal effort.  Abd:  No distention.  Mild suprapubic discomfort, no peritoneal features. MSK:  No deformity noted.  Neuro:  No focal deficits appreciated. Other:     ED Results / Procedures / Treatments   Labs (all labs ordered are listed, but only  abnormal results are displayed) Labs Reviewed  COMPREHENSIVE METABOLIC PANEL - Abnormal; Notable for the following components:      Result Value   CO2 19 (*)    Glucose, Bld 176 (*)    BUN 25 (*)    Creatinine, Ser 1.05 (*)    Alkaline Phosphatase 203 (*)    GFR, Estimated 54 (*)    All other components within normal limits  CBC - Abnormal; Notable for the following components:   WBC 16.4 (*)    RBC 5.12 (*)    RDW 16.6 (*)    All other components within normal limits  URINALYSIS, ROUTINE W REFLEX MICROSCOPIC - Abnormal; Notable for the following components:   Color, Urine YELLOW (*)    APPearance CLEAR (*)    Ketones, ur 5 (*)    All other components within normal limits    EKG   RADIOLOGY CT admit/pelvis interpreted by me with stool ball in the rectum  Official radiology report(s): CT ABDOMEN PELVIS WO CONTRAST  Result  Date: 11/04/2022 CLINICAL DATA:  Urinary retention and constipation. One hydrocodone for foot surgery EXAM: CT ABDOMEN AND PELVIS WITHOUT CONTRAST TECHNIQUE: Multidetector CT imaging of the abdomen and pelvis was performed following the standard protocol without IV contrast. RADIATION DOSE REDUCTION: This exam was performed according to the departmental dose-optimization program which includes automated exposure control, adjustment of the mA and/or kV according to patient size and/or use of iterative reconstruction technique. COMPARISON:  Abdominal MRI 04/08/2018 FINDINGS: Lower chest: 7 mm pulmonary nodule in the left lower lobe, not mentioned on a 2013 chest CT report from outside facility in care everywhere. Volume loss and opacification in the right lower lobe attributed to scarring. Hepatobiliary: No focal liver abnormality. 4 cm heavily calcified high-density mass in line with right nephrectomy clips and contacting the inferior right liver. Cholecystectomy. No biliary dilatation. Pancreas: Unremarkable. Spleen: Unremarkable. Adrenals/Urinary Tract: Negative  adrenals. Right nephrectomy. Left renal hilar cysts. No hydronephrosis. Unremarkable bladder. Stomach/Bowel: Stool distended rectum with mesorectal fat stranding. The rectum is distended to nearly 8 cm. Proximal to the stool there is liquid colonic contents. Vascular/Lymphatic: No acute vascular abnormality. No mass or adenopathy. Reproductive:Unremarkable for age Other: No ascites or pneumoperitoneum. Midline epigastric hernia which is fatty. Musculoskeletal: No acute abnormalities. Chronic L4 and L5 superior endplate fractures. IMPRESSION: 1. 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 Liquid stool is seen in the more proximal colon. 2. 7 mm nodule in the left lower lobe. Non-contrast chest CT at 6-12 months is recommended. If the nodule is stable at time of repeat CT, then future CT at 18-24 months (from today's scan) is considered optional for low-risk patients, but is recommended for high-risk patients. This recommendation follows the consensus statement: Guidelines for Management of Incidental Pulmonary Nodules Detected on CT Images: From the Fleischner Society 2017; Radiology 2017; 284:228-243. 3. Right nephrectomy. Electronically Signed   By: Tiburcio Pea M.D.   On: 11/04/2022 05:09    PROCEDURES and INTERVENTIONS:  Procedures  Medications  lactated ringers bolus 1,000 mL (1,000 mLs Intravenous New Bag/Given 11/04/22 0348)  polyethylene glycol (MIRALAX / GLYCOLAX) packet 34 g (34 g Oral Given 11/04/22 0347)     IMPRESSION / MDM / ASSESSMENT AND PLAN / ED COURSE  I reviewed the triage vital signs and the nursing notes.  Differential diagnosis includes, but is not limited to, urinary retention, constipation, SBO  {Patient presents with symptoms of an acute illness or injury that is potentially life-threatening.  Patient presents with constipation and urinary retention, likely drug-induced constipation from her recently prescribed opiates subsequently causing acute urinary  retention from the degree of local pressure from her full rectum.  After manual disimpaction, IV fluids and MiraLAX, she passes a large stool and subsequently passes urine and does not require indwelling Foley catheter placement.  Does require In-N-Out catheter initially she presents.  Blood work does have leukocytosis, mild nonanion gap metabolic acidosis.  Ketones in the urine are mildly suggestive of dehydration.  CT with stool ball in the rectum and stigmata of stercoral colitis.  After gentle disimpaction, she passes a large bowel movement and has resolution of all symptoms, subsequently voiding without retention.  Suitable for outpatient management.  Discussed close return precautions and MiraLAX at home and she continues her opiates for her postoperative foot pain.  Clinical Course as of 11/04/22 0723  Fri Nov 04, 2022  1610 Disimpaction performed. Well tolerated. Good amount of hard stool removed. She still has a sensation of inability to void despite needing to  do so [DS]  0653 Reassessed.  Reports reports feeling somewhat better.  We discussed CT results.  She reports she wants to try to get up and bedside commode and void.  We discussed voiding trial, postvoid residual and possible need for Foley catheter.  She is eager to go home without a catheter. [DS]  5284 Reassessed.  Patient reports "feeling like a new woman."  She passed a large bowel movement and avoid and feels much better.  We discussed MiraLAX at home alongside her opiates.  Discussed return precautions. [DS]    Clinical Course User Index [DS] Delton Prairie, MD     FINAL CLINICAL IMPRESSION(S) / ED DIAGNOSES   Final diagnoses:  Drug-induced constipation  Urinary retention     Rx / DC Orders   ED Discharge Orders     None        Note:  This document was prepared using Dragon voice recognition software and may include unintentional dictation errors.   Delton Prairie, MD 11/04/22 0730

## 2022-11-04 NOTE — Discharge Instructions (Addendum)
Use MiraLAX, or its generic equivalent, for your constipation.  Mix this white powder in your favorite noncarbonated drink, such as milk, water or juice.  To begin with and to get things moving, use 1-2 capfuls up to twice per day.  Once you are passing more regular bowel movements, cut back to about 1 capful once or twice a day.  It is safe to use suppositories or enemas with this medication.  Please stay hydrated and drink plenty of fluids while you are taking this medication.

## 2023-03-07 ENCOUNTER — Ambulatory Visit
Admission: RE | Admit: 2023-03-07 | Discharge: 2023-03-07 | Disposition: A | Discharge status: Home / Self Care | Payer: Medicare Other | Source: Ambulatory Visit | Attending: Podiatry | Admitting: Podiatry

## 2023-03-07 ENCOUNTER — Other Ambulatory Visit: Payer: Self-pay | Admitting: Podiatry

## 2023-03-07 DIAGNOSIS — S92351D Displaced fracture of fifth metatarsal bone, right foot, subsequent encounter for fracture with routine healing: Secondary | ICD-10-CM

## 2023-03-07 DIAGNOSIS — E119 Type 2 diabetes mellitus without complications: Secondary | ICD-10-CM

## 2023-03-07 DIAGNOSIS — M79671 Pain in right foot: Secondary | ICD-10-CM

## 2023-06-01 ENCOUNTER — Other Ambulatory Visit: Payer: Self-pay | Admitting: Internal Medicine

## 2023-06-01 DIAGNOSIS — I10 Essential (primary) hypertension: Secondary | ICD-10-CM

## 2023-06-01 DIAGNOSIS — R911 Solitary pulmonary nodule: Secondary | ICD-10-CM

## 2023-06-06 ENCOUNTER — Ambulatory Visit
Admission: RE | Admit: 2023-06-06 | Discharge: 2023-06-06 | Disposition: A | Discharge status: Home / Self Care | Source: Ambulatory Visit | Attending: Internal Medicine | Admitting: Internal Medicine

## 2023-06-06 DIAGNOSIS — I10 Essential (primary) hypertension: Secondary | ICD-10-CM | POA: Diagnosis present

## 2023-06-06 DIAGNOSIS — R911 Solitary pulmonary nodule: Secondary | ICD-10-CM | POA: Insufficient documentation

## 2023-06-22 ENCOUNTER — Inpatient Hospital Stay

## 2023-06-22 ENCOUNTER — Inpatient Hospital Stay: Attending: Oncology | Admitting: Oncology

## 2023-06-22 ENCOUNTER — Encounter: Payer: Self-pay | Admitting: Oncology

## 2023-06-22 VITALS — BP 142/68 | HR 63 | Temp 96.2°F | Resp 18 | Ht 65.0 in | Wt 206.7 lb

## 2023-06-22 DIAGNOSIS — Z885 Allergy status to narcotic agent status: Secondary | ICD-10-CM | POA: Diagnosis not present

## 2023-06-22 DIAGNOSIS — Z803 Family history of malignant neoplasm of breast: Secondary | ICD-10-CM

## 2023-06-22 DIAGNOSIS — Z880 Allergy status to penicillin: Secondary | ICD-10-CM

## 2023-06-22 DIAGNOSIS — Z881 Allergy status to other antibiotic agents status: Secondary | ICD-10-CM | POA: Diagnosis not present

## 2023-06-22 DIAGNOSIS — I2510 Atherosclerotic heart disease of native coronary artery without angina pectoris: Secondary | ICD-10-CM | POA: Insufficient documentation

## 2023-06-22 DIAGNOSIS — I10 Essential (primary) hypertension: Secondary | ICD-10-CM | POA: Diagnosis not present

## 2023-06-22 DIAGNOSIS — R918 Other nonspecific abnormal finding of lung field: Secondary | ICD-10-CM | POA: Insufficient documentation

## 2023-06-22 DIAGNOSIS — C493 Malignant neoplasm of connective and soft tissue of thorax: Secondary | ICD-10-CM | POA: Insufficient documentation

## 2023-06-22 DIAGNOSIS — Z825 Family history of asthma and other chronic lower respiratory diseases: Secondary | ICD-10-CM

## 2023-06-22 DIAGNOSIS — Z79899 Other long term (current) drug therapy: Secondary | ICD-10-CM | POA: Diagnosis not present

## 2023-06-22 DIAGNOSIS — R911 Solitary pulmonary nodule: Secondary | ICD-10-CM | POA: Diagnosis not present

## 2023-06-22 DIAGNOSIS — K5730 Diverticulosis of large intestine without perforation or abscess without bleeding: Secondary | ICD-10-CM | POA: Insufficient documentation

## 2023-06-22 DIAGNOSIS — K439 Ventral hernia without obstruction or gangrene: Secondary | ICD-10-CM | POA: Diagnosis not present

## 2023-06-22 DIAGNOSIS — Z7982 Long term (current) use of aspirin: Secondary | ICD-10-CM | POA: Diagnosis not present

## 2023-06-22 DIAGNOSIS — Z85528 Personal history of other malignant neoplasm of kidney: Secondary | ICD-10-CM | POA: Diagnosis not present

## 2023-06-22 DIAGNOSIS — Z86000 Personal history of in-situ neoplasm of breast: Secondary | ICD-10-CM

## 2023-06-22 DIAGNOSIS — N6310 Unspecified lump in the right breast, unspecified quadrant: Secondary | ICD-10-CM | POA: Diagnosis present

## 2023-06-22 DIAGNOSIS — Z905 Acquired absence of kidney: Secondary | ICD-10-CM | POA: Insufficient documentation

## 2023-06-22 DIAGNOSIS — E041 Nontoxic single thyroid nodule: Secondary | ICD-10-CM | POA: Diagnosis not present

## 2023-06-22 DIAGNOSIS — M7989 Other specified soft tissue disorders: Secondary | ICD-10-CM

## 2023-06-22 DIAGNOSIS — Z8379 Family history of other diseases of the digestive system: Secondary | ICD-10-CM

## 2023-06-22 DIAGNOSIS — Z9011 Acquired absence of right breast and nipple: Secondary | ICD-10-CM

## 2023-06-22 DIAGNOSIS — Z9049 Acquired absence of other specified parts of digestive tract: Secondary | ICD-10-CM

## 2023-06-22 DIAGNOSIS — Z833 Family history of diabetes mellitus: Secondary | ICD-10-CM

## 2023-06-22 DIAGNOSIS — N6489 Other specified disorders of breast: Secondary | ICD-10-CM | POA: Diagnosis not present

## 2023-06-22 DIAGNOSIS — Z923 Personal history of irradiation: Secondary | ICD-10-CM | POA: Diagnosis not present

## 2023-06-22 DIAGNOSIS — N281 Cyst of kidney, acquired: Secondary | ICD-10-CM | POA: Insufficient documentation

## 2023-06-22 DIAGNOSIS — R600 Localized edema: Secondary | ICD-10-CM | POA: Diagnosis not present

## 2023-06-22 DIAGNOSIS — R928 Other abnormal and inconclusive findings on diagnostic imaging of breast: Secondary | ICD-10-CM

## 2023-06-22 NOTE — Progress Notes (Signed)
 [facility]  Telephone:([phone]) [phone] Fax:(336) [phone]  ID: CHIYOKO TORRICO OB: [birth year]  MR#: [id number]  AOZ#:[id number]  Patient Care Team: Gracelyn Nurse, MD as PCP - General (Internal Medicine) Jeralyn Ruths, MD as Consulting Physician (Oncology)  CHIEF COMPLAINT: Chest wall/right breast mass.  INTERVAL HISTORY: Patient is an 83 year old female with a history of DCIS who was noted incidentally to have a chest wall/right breast mass on follow-up chest CT to assess interval change for pulmonary nodule.  She currently feels well and is asymptomatic.  She has no neurological plaints.  She denies any recent fevers or illnesses.  She has a good appetite and denies weight loss.  She has no chest pain, shortness of breath, cough, or hemoptysis.  She denies any nausea, vomiting, constipation, or diarrhea.  She has no urinary complaints.  Patient feels at her baseline and offers no further specific complaints today.  REVIEW OF SYSTEMS:   Review of Systems  Constitutional: Negative.  Negative for fever, malaise/fatigue and weight loss.  Respiratory: Negative.  Negative for cough, hemoptysis and shortness of breath.   Cardiovascular: Negative.  Negative for chest pain and leg swelling.  Gastrointestinal: Negative.  Negative for abdominal pain.  Genitourinary: Negative.  Negative for dysuria.  Musculoskeletal: Negative.  Negative for myalgias.  Skin: Negative.  Negative for rash.  Neurological:  Negative for dizziness, focal weakness, weakness and headaches.  Psychiatric/Behavioral: Negative.  The patient is not nervous/anxious.     As per HPI. Otherwise, a complete review of systems is negative.  PAST MEDICAL HISTORY: Past Medical History:  Diagnosis Date   Breast cancer (HCC) 2014   RT LUMPECTOMY   Cough    CHRONIC   Diabetes mellitus without complication (HCC)    no meds since 2012   Diverticulosis    Dizziness    MEDICINE RELATED   Dysrhythmia     GERD (gastroesophageal reflux disease)    Heart palpitations    Hepatic steatosis    High cholesterol    History of hiatal hernia    History of partial mastectomy of right breast    Hypertension    IBS (irritable bowel syndrome)    Liver mass    Motion sickness    cars   Neoplasm of kidney    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HCC)    Personal history of radiation therapy 2014   BREAST CA   Radiation 2014   BREAST CA   Renal cell carcinoma (HCC) 5005   Renal insufficiency     PAST SURGICAL HISTORY: Past Surgical History:  Procedure Laterality Date   APPENDECTOMY     BREAST EXCISIONAL BIOPSY Right 08/20/2012   hx of breast ca but close .25mm margins radation no chemo   BREAST LUMPECTOMY Right 2014   BREAST CA   CATARACT EXTRACTION W/PHACO Right 10/27/2014   Procedure: CATARACT EXTRACTION PHACO AND INTRAOCULAR LENS PLACEMENT (IOC);  Surgeon: Sallee Lange, MD;  Location: [facility];  Service: Ophthalmology;  Laterality: Right;  [place]: 01:30.0 AP%: 25.7 CDE: 40.11 Fluid lot# [id number]   CATARACT EXTRACTION W/PHACO Left 10/31/2017   Procedure: CATARACT EXTRACTION PHACO AND INTRAOCULAR LENS PLACEMENT (IOC) LEFT;  Surgeon: Lockie Mola, MD;  Location: [facility];  Service: Ophthalmology;  Laterality: Left;   CHOLECYSTECTOMY     COLONOSCOPY WITH PROPOFOL N/A 10/08/2014   Procedure: COLONOSCOPY WITH PROPOFOL;  Surgeon: Scot Jun, MD;  Location: [facility];  Service: Endoscopy;  Laterality: N/A;   COLONOSCOPY WITH PROPOFOL N/A 12/25/2019   Procedure:  COLONOSCOPY WITH PROPOFOL;  Surgeon: Earline Mayotte, MD;  Location: [facility];  Service: Endoscopy;  Laterality: N/A;   ESOPHAGOGASTRODUODENOSCOPY (EGD) WITH PROPOFOL N/A 10/08/2014   Procedure: ESOPHAGOGASTRODUODENOSCOPY (EGD) WITH PROPOFOL;  Surgeon: Scot Jun, MD;  Location: [facility];  Service: Endoscopy;  Laterality: N/A;   EYE SURGERY     macular hole repair   LEG SURGERY Right    FX PINS,SCREWS    NEPHRECTOMT Right 06/2003   REPAIR EXTENSOR TENDON WITH METATARSAL OSTEOTOMY AND OPEN REDUCTION IN Right 10/27/2022   Procedure: METATARSAL  OPEN REDUCTION INTERNAL FIXATION (ORIF) METATARSAL;  Surgeon: Rosetta Posner, DPM;  Location: [facility];  Service: Orthopedics/Podiatry;  Laterality: Right;  Diabetic   TONSILLECTOMY      FAMILY HISTORY: Family History  Problem Relation Age of Onset   Diabetes Other    COPD Other    Colitis Other    Breast cancer Paternal Aunt     ADVANCED DIRECTIVES (Y/N):  N  HEALTH MAINTENANCE: Social History   Tobacco Use   Smoking status: Never   Smokeless tobacco: Never  Vaping Use   Vaping status: Never Used  Substance Use Topics   Alcohol use: No   Drug use: Never     Colonoscopy:  PAP:  Bone density:  Lipid panel:  Allergies  Allergen Reactions   Amoxicillin-Pot Clavulanate Nausea Only    Other Reaction(s): Other (see comments)   Cefdinir Nausea And Vomiting and Nausea Only   Clindamycin Nausea And Vomiting   Codeine Nausea Only    Other reaction(s): Other (See Comments)  Other Reaction: Not Assessed   Latex Rash    RAST NEGATIVE    Current Outpatient Medications  Medication Sig Dispense Refill   acetaminophen (TYLENOL) 500 MG tablet Take 1,000 mg by mouth every 6 (six) hours as needed.     albuterol (VENTOLIN HFA) 108 (90 Base) MCG/ACT inhaler Inhale 2 puffs into the lungs every 6 (six) hours as needed.     allopurinol (ZYLOPRIM) 100 MG tablet Take 200 mg by mouth daily.     amLODipine (NORVASC) 2.5 MG tablet   5   aspirin 81 MG tablet Take 1 tablet (81 mg total) by mouth 2 (two) times daily.     benazepril (LOTENSIN) 10 MG tablet Take 10 mg by mouth daily.     calcitRIOL (ROCALTROL) 0.25 MCG capsule Take 0.25 mcg by mouth daily.     Cholecalciferol (VITAMIN D) 2000 units tablet Take 2,000 Units by mouth daily.     docusate sodium (COLACE) 100 MG capsule Take 100 mg by mouth 2 (two) times daily as needed for mild  constipation.     glipiZIDE (GLUCOTROL XL) 5 MG 24 hr tablet Take 5 mg by mouth daily with breakfast.     Multiple Vitamins-Minerals (PRESERVISION AREDS) TABS Take 60 mg by mouth 2 (two) times daily.     pantoprazole (PROTONIX) 40 MG tablet Take 40 mg by mouth daily.     rosuvastatin (CRESTOR) 10 MG tablet Take 10 mg by mouth daily.     sucralfate (CARAFATE) 1 G tablet Take 1 g by mouth 4 (four) times daily -  with meals and at bedtime.     triamterene-hydrochlorothiazide (MAXZIDE-25) 37.5-25 MG per tablet Take 1 tablet by mouth daily.     doxycycline (ADOXA) 100 MG tablet Take 100 mg by mouth 2 (two) times daily. (Patient not taking: Reported on 06/22/2023)     No current facility-administered medications for this visit.  OBJECTIVE: Vitals:   06/22/23 1001  BP: (!) 142/68  Pulse: 63  Resp: 18  Temp: (!) 96.2 F (35.7 C)  SpO2: 100%     Body mass index is 34.4 kg/m.    ECOG FS:0 - Asymptomatic  General: Well-developed, well-nourished, no acute distress. Eyes: Pink conjunctiva, anicteric sclera. HEENT: Normocephalic, moist mucous membranes. Lungs: No audible wheezing or coughing. Heart: Regular rate and rhythm. Abdomen: Soft, nontender, no obvious distention. Musculoskeletal: No edema, cyanosis, or clubbing. Neuro: Alert, answering all questions appropriately. Cranial nerves grossly intact. Skin: No rashes or petechiae noted. Psych: Normal affect. Lymphatics: No cervical, calvicular, axillary or inguinal LAD.   LAB RESULTS:  Lab Results  Component Value Date   NA 136 11/03/2022   K 3.9 11/03/2022   CL 107 11/03/2022   CO2 19 (L) 11/03/2022   GLUCOSE 176 (H) 11/03/2022   BUN 25 (H) 11/03/2022   CREATININE 1.05 (H) 11/03/2022   CALCIUM 9.5 11/03/2022   PROT 6.8 11/03/2022   ALBUMIN 3.5 11/03/2022   AST 15 11/03/2022   ALT 30 11/03/2022   ALKPHOS 203 (H) 11/03/2022   BILITOT 0.7 11/03/2022   GFRNONAA 54 (L) 11/03/2022    Lab Results  Component Value Date   WBC  16.4 (H) 11/03/2022   NEUTROABS 4.9 03/16/2016   HGB 13.4 11/03/2022   HCT 42.6 11/03/2022   MCV 83.2 11/03/2022   PLT 180 11/03/2022     STUDIES: CT CHEST WO CONTRAST Result Date: 06/13/2023 CLINICAL DATA:  Lung nodule follow-up EXAM: CT CHEST WITHOUT CONTRAST TECHNIQUE: Multidetector CT imaging of the chest was performed following the standard protocol without IV contrast. RADIATION DOSE REDUCTION: This exam was performed according to the departmental dose-optimization program which includes automated exposure control, adjustment of the mA and/or kV according to patient size and/or use of iterative reconstruction technique. COMPARISON:  CT abdomen and pelvis November 04, 2022 FINDINGS: Cardiovascular: No significant vascular findings. Normal heart size. No pericardial effusion. No significant coronary artery calcifications no pericardial effusions ascending aorta within upper limits of normal measuring 3.9 x 3.7 cm in diameter. Mediastinum/Nodes: No enlarged mediastinal or axillary lymph nodes. Thyroid gland, trachea, and esophagus demonstrate no significant findings. Low-attenuation soft tissue nodular density within the anterior margin of the isthmus of the thyroid measuring 1.4 x 1.8 cm and correlation with thyroid ultrasound recommended. Lungs/Pleura: Comparison with prior examination no change in the 7 mm nodule of the left lower lobe image 81. 2 mm nodule of the right middle lobe lateral segment image 73. 3 mm nodule right lower lobe image 84. No change in the chronic right pleural reaction and pleural thickening with calcifications of the pleura and nodular hypoventilatory atelectatic changes of the right posterior costophrenic sulcus compared with prior examinations, findings could correlate with prior inflammatory process and or could correlate with asbestos related pleural disease. Upper Abdomen: No change in the epigastric hernia to the right of the midline containing fat only Musculoskeletal:  Soft tissue mass measuring 2.2 by 2.0 by 2.9 cm is noted in the right anterior chest wall medial and deep to the breast attached to the anterior chest wall muscle plane. Solid lobular mass highly suspicious for malignancy. Consider ultrasound-guided biopsy. IMPRESSION: *Soft tissue mass in the right anterior chest wall medial and deep to the breast attached to the anterior chest wall muscle plane. Solid lobular mass highly suspicious for malignancy. Consider ultrasound-guided biopsy. *No change in the 7 mm nodule of the left lower lobe. *No change in the chronic  right pleural reaction and pleural thickening with calcifications of the pleura and nodular hypoventilatory atelectatic changes of the right posterior costophrenic sulcus compared with prior examinations, findings could correlate with prior inflammatory process and or could correlate with asbestos related pleural disease. *Low-attenuation soft tissue nodular density within the anterior margin of the isthmus of the thyroid measuring 1.4 x 1.8 cm. *No change in the epigastric hernia to the right of the midline containing fat only. Electronically Signed   By: Shaaron Adler M.D.   On: 06/13/2023 10:12    ASSESSMENT: Chest wall/right breast mass.  PLAN:    Chest wall/right breast mass: CT scan results from June 13, 2023 reviewed independently and reported as above with a 2.2 x 2.0 x 2.9 right anterior chest wall mass/breast mass incidentally noted on follow-up chest CT to assess for pulmonary nodule.  Patient has a history of DCIS in the right breast.  Mass was not noted on mammogram in July 2024.  Will repeat mammogram and ultrasound and if visible, we will pursue biopsy.  If ultrasound-guided biopsy is not possible, we will then order CT-guided biopsy.  Patient will likely require PET scan in the future, but will await diagnosis before ordering.  Return to clinic 1 week after her biopsy to discuss the results. Left lower lobe pulmonary nodule: Stable  and unchanged at 7 mm.  Likely benign. 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  She completed 5 years of tamoxifen in August 2019.   I spent a total of 60 minutes reviewing chart data, face-to-face evaluation with the patient, counseling and coordination of care as detailed above.   Patient expressed understanding and was in agreement with this plan. She also understands that She can call clinic at any time with any questions, concerns, or complaints.    Cancer Staging  Ductal carcinoma in situ (DCIS) of right breast Staging form: Breast, AJCC 7th Edition - Clinical stage from 09/21/2014: Stage 0 (Tis (DCIS), N0, M0) - Signed by Jeralyn Ruths, MD on 09/21/2014 Laterality: Right Estrogen receptor status: Positive Progesterone receptor status: Positive   Jeralyn Ruths, MD   06/24/2023 8:07 AM

## 2023-06-29 ENCOUNTER — Ambulatory Visit
Admission: RE | Admit: 2023-06-29 | Discharge: 2023-06-29 | Disposition: A | Discharge status: Home / Self Care | Source: Ambulatory Visit | Attending: Oncology | Admitting: Oncology

## 2023-06-29 ENCOUNTER — Other Ambulatory Visit: Payer: Self-pay | Admitting: Oncology

## 2023-06-29 DIAGNOSIS — M7989 Other specified soft tissue disorders: Secondary | ICD-10-CM | POA: Insufficient documentation

## 2023-06-29 DIAGNOSIS — R928 Other abnormal and inconclusive findings on diagnostic imaging of breast: Secondary | ICD-10-CM

## 2023-07-03 ENCOUNTER — Encounter: Payer: Self-pay | Admitting: Oncology

## 2023-07-05 ENCOUNTER — Ambulatory Visit
Admission: RE | Admit: 2023-07-05 | Discharge: 2023-07-05 | Disposition: A | Discharge status: Home / Self Care | Source: Ambulatory Visit | Attending: Oncology | Admitting: Oncology

## 2023-07-05 DIAGNOSIS — D0511 Intraductal carcinoma in situ of right breast: Secondary | ICD-10-CM | POA: Insufficient documentation

## 2023-07-05 DIAGNOSIS — R928 Other abnormal and inconclusive findings on diagnostic imaging of breast: Secondary | ICD-10-CM | POA: Diagnosis present

## 2023-07-05 HISTORY — PX: BREAST BIOPSY: SHX20

## 2023-07-05 MED ORDER — LIDOCAINE-EPINEPHRINE (PF) 1 %-1:200000 IJ SOLN
10.0000 mL | Freq: Once | INTRAMUSCULAR | Status: AC
  Administered 2023-07-05: 10 mL via INTRADERMAL
  Filled 2023-07-05: qty 10

## 2023-07-05 MED ORDER — LIDOCAINE HCL (PF) 1 % IJ SOLN
5.0000 mL | Freq: Once | INTRAMUSCULAR | Status: AC
  Administered 2023-07-05: 5 mL via INTRADERMAL
  Filled 2023-07-05: qty 5

## 2023-07-10 ENCOUNTER — Other Ambulatory Visit: Payer: Self-pay | Admitting: *Deleted

## 2023-07-10 ENCOUNTER — Ambulatory Visit: Admitting: Oncology

## 2023-07-10 DIAGNOSIS — M7989 Other specified soft tissue disorders: Secondary | ICD-10-CM

## 2023-07-10 DIAGNOSIS — R928 Other abnormal and inconclusive findings on diagnostic imaging of breast: Secondary | ICD-10-CM

## 2023-07-10 LAB — SURGICAL PATHOLOGY

## 2023-07-11 ENCOUNTER — Encounter: Payer: Self-pay | Admitting: Oncology

## 2023-07-11 ENCOUNTER — Inpatient Hospital Stay (HOSPITAL_BASED_OUTPATIENT_CLINIC_OR_DEPARTMENT_OTHER): Admitting: Oncology

## 2023-07-11 VITALS — BP 145/61 | HR 65 | Temp 98.3°F | Resp 16 | Ht 65.0 in | Wt 206.9 lb

## 2023-07-11 DIAGNOSIS — C493 Malignant neoplasm of connective and soft tissue of thorax: Secondary | ICD-10-CM | POA: Diagnosis not present

## 2023-07-11 NOTE — Progress Notes (Unsigned)
 [facility]  [phone]) [phone] Fax:(336) [phone]  ID: LAN ENTSMINGER OB: [id number]  MR#: [id number]  AOZ#:[id number]  Patient Care Team: Little Riff, MD as PCP - General (Internal Medicine) Shellie Dials, MD as Consulting Physician (Oncology)  CHIEF COMPLAINT: Malignant sarcomatous neoplasm.  INTERVAL HISTORY: Patient returns to clinic today for further evaluation and discussion of her pathology results.  She is anxious, but otherwise feels well.  She has no neurologic complaints.  She denies any recent fevers or illnesses.  She has a good appetite and denies weight loss.  She has no chest pain, shortness of breath, cough, or hemoptysis.  She denies any nausea, vomiting, constipation, or diarrhea.  She has no urinary complaints.  Patient offers no further specific complaints today.  REVIEW OF SYSTEMS:   Review of Systems  Constitutional: Negative.  Negative for fever, malaise/fatigue and weight loss.  Respiratory: Negative.  Negative for cough, hemoptysis and shortness of breath.   Cardiovascular: Negative.  Negative for chest pain and leg swelling.  Gastrointestinal: Negative.  Negative for abdominal pain.  Genitourinary: Negative.  Negative for dysuria.  Musculoskeletal: Negative.  Negative for myalgias.  Skin: Negative.  Negative for rash.  Neurological:  Negative for dizziness, focal weakness, weakness and headaches.  Psychiatric/Behavioral:  The patient is nervous/anxious.     As per HPI. Otherwise, a complete review of systems is negative.  PAST MEDICAL HISTORY: Past Medical History:  Diagnosis Date   Breast cancer (HCC) 2014   RT LUMPECTOMY   Cough    CHRONIC   Diabetes mellitus without complication (HCC)    no meds since 2012   Diverticulosis    Dizziness    MEDICINE RELATED   Dysrhythmia    GERD (gastroesophageal reflux disease)    Heart palpitations    Hepatic steatosis    High cholesterol    History of hiatal hernia     History of partial mastectomy of right breast    Hypertension    IBS (irritable bowel syndrome)    Liver mass    Motion sickness    cars   Neoplasm of kidney    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HCC)    Personal history of radiation therapy 2014   BREAST CA   Radiation 2014   BREAST CA   Renal cell carcinoma (HCC) 5005   Renal insufficiency     PAST SURGICAL HISTORY: Past Surgical History:  Procedure Laterality Date   APPENDECTOMY     BREAST BIOPSY Right 07/05/2023   US  RT BREAST BX W LOC DEV 1ST LESION IMG BX SPEC US  GUIDE 07/05/2023 [facility]   BREAST EXCISIONAL BIOPSY Right 08/20/2012   hx of breast ca but close .25mm margins radation no chemo   BREAST LUMPECTOMY Right 2014   BREAST CA   CATARACT EXTRACTION W/PHACO Right 10/27/2014   Procedure: CATARACT EXTRACTION PHACO AND INTRAOCULAR LENS PLACEMENT (IOC);  Surgeon: Steven Dingeldein, MD;  Location: [facility] ORS;  Service: Ophthalmology;  Laterality: Right;  US : 01:30.0 AP%: 25.7 CDE: 40.11 Fluid lot# [id number]   CATARACT EXTRACTION W/PHACO Left 10/31/2017   Procedure: CATARACT EXTRACTION PHACO AND INTRAOCULAR LENS PLACEMENT (IOC) LEFT;  Surgeon: Annell Kidney, MD;  Location: [facility];  Service: Ophthalmology;  Laterality: Left;   CHOLECYSTECTOMY     COLONOSCOPY WITH PROPOFOL  N/A 10/08/2014   Procedure: COLONOSCOPY WITH PROPOFOL ;  Surgeon: Cassie Click, MD;  Location: [facility] ENDOSCOPY;  Service: Endoscopy;  Laterality: N/A;   COLONOSCOPY WITH PROPOFOL  N/A 12/25/2019   Procedure: COLONOSCOPY WITH  PROPOFOL ;  Surgeon: Marshall Skeeter, MD;  Location: [facility] ENDOSCOPY;  Service: Endoscopy;  Laterality: N/A;   ESOPHAGOGASTRODUODENOSCOPY (EGD) WITH PROPOFOL  N/A 10/08/2014   Procedure: ESOPHAGOGASTRODUODENOSCOPY (EGD) WITH PROPOFOL ;  Surgeon: Cassie Click, MD;  Location: [facility];  Service: Endoscopy;  Laterality: N/A;   EYE SURGERY     macular hole repair   LEG SURGERY Right    FX PINS,SCREWS   NEPHRECTOMT  Right 06/2003   REPAIR EXTENSOR TENDON WITH METATARSAL OSTEOTOMY AND OPEN REDUCTION IN Right 10/27/2022   Procedure: METATARSAL  OPEN REDUCTION INTERNAL FIXATION (ORIF) METATARSAL;  Surgeon: Pink Bridges, DPM;  Location: [facility];  Service: Orthopedics/Podiatry;  Laterality: Right;  Diabetic   TONSILLECTOMY      FAMILY HISTORY: Family History  Problem Relation Age of Onset   Diabetes Other    COPD Other    Colitis Other    Breast cancer Paternal Aunt     ADVANCED DIRECTIVES (Y/N):  N  HEALTH MAINTENANCE: Social History   Tobacco Use   Smoking status: Never   Smokeless tobacco: Never  Vaping Use   Vaping status: Never Used  Substance Use Topics   Alcohol use: No   Drug use: Never     Colonoscopy:  PAP:  Bone density:  Lipid panel:  Allergies  Allergen Reactions   Amoxicillin-Pot Clavulanate Nausea Only    Other Reaction(s): Other (see comments)   Cefdinir Nausea And Vomiting and Nausea Only   Clindamycin Nausea And Vomiting   Codeine Nausea Only    Other reaction(s): Other (See Comments)  Other Reaction: Not Assessed   Latex Rash    RAST NEGATIVE    Current Outpatient Medications  Medication Sig Dispense Refill   acetaminophen  (TYLENOL ) 500 MG tablet Take 1,000 mg by mouth every 6 (six) hours as needed.     albuterol (VENTOLIN HFA) 108 (90 Base) MCG/ACT inhaler Inhale 2 puffs into the lungs every 6 (six) hours as needed.     allopurinol (ZYLOPRIM) 100 MG tablet Take 200 mg by mouth daily.     amLODipine (NORVASC) 2.5 MG tablet   5   aspirin  81 MG tablet Take 1 tablet (81 mg total) by mouth 2 (two) times daily.     benazepril (LOTENSIN) 10 MG tablet Take 10 mg by mouth daily.     calcitRIOL (ROCALTROL) 0.25 MCG capsule Take 0.25 mcg by mouth daily.     Cholecalciferol (VITAMIN D) 2000 units tablet Take 2,000 Units by mouth daily.     docusate sodium (COLACE) 100 MG capsule Take 100 mg by mouth 2 (two) times daily as needed for mild constipation.      glipiZIDE (GLUCOTROL XL) 5 MG 24 hr tablet Take 5 mg by mouth daily with breakfast.     Multiple Vitamins-Minerals (PRESERVISION AREDS) TABS Take 60 mg by mouth 2 (two) times daily.     pantoprazole (PROTONIX) 40 MG tablet Take 40 mg by mouth daily.     rosuvastatin (CRESTOR) 10 MG tablet Take 10 mg by mouth daily.     sucralfate (CARAFATE) 1 G tablet Take 1 g by mouth 4 (four) times daily -  with meals and at bedtime.     triamterene-hydrochlorothiazide (MAXZIDE-25) 37.5-25 MG per tablet Take 1 tablet by mouth daily.     doxycycline (ADOXA) 100 MG tablet Take 100 mg by mouth 2 (two) times daily. (Patient not taking: Reported on 07/11/2023)     No current facility-administered medications for this visit.    OBJECTIVE: Vitals:  07/11/23 1002  BP: (!) 145/61  Pulse: 65  Resp: 16  Temp: 98.3 F (36.8 C)  SpO2: 100%     Body mass index is 34.43 kg/m.    ECOG FS:0 - Asymptomatic  General: Well-developed, well-nourished, no acute distress. Eyes: Pink conjunctiva, anicteric sclera. HEENT: Normocephalic, moist mucous membranes. Lungs: No audible wheezing or coughing. Heart: Regular rate and rhythm. Abdomen: Soft, nontender, no obvious distention. Musculoskeletal: No edema, cyanosis, or clubbing. Neuro: Alert, answering all questions appropriately. Cranial nerves grossly intact. Skin: No rashes or petechiae noted. Psych: Normal affect.  LAB RESULTS:  Lab Results  Component Value Date   NA 136 11/03/2022   K 3.9 11/03/2022   CL 107 11/03/2022   CO2 19 (L) 11/03/2022   GLUCOSE 176 (H) 11/03/2022   BUN 25 (H) 11/03/2022   CREATININE 1.05 (H) 11/03/2022   CALCIUM 9.5 11/03/2022   PROT 6.8 11/03/2022   ALBUMIN 3.5 11/03/2022   AST 15 11/03/2022   ALT 30 11/03/2022   ALKPHOS 203 (H) 11/03/2022   BILITOT 0.7 11/03/2022   GFRNONAA 54 (L) 11/03/2022    Lab Results  Component Value Date   WBC 16.4 (H) 11/03/2022   NEUTROABS 4.9 03/16/2016   HGB 13.4 11/03/2022   HCT 42.6  11/03/2022   MCV 83.2 11/03/2022   PLT 180 11/03/2022     STUDIES: NM PET Image Initial (PI) Whole Body Result Date: 07/12/2023 CLINICAL DATA:  Initial treatment strategy for malignant sarcomatous neoplasm. Right breast cancer. EXAM: NUCLEAR MEDICINE PET WHOLE BODY TECHNIQUE: 10.75 mCi F-18 FDG was injected intravenously. Full-ring PET imaging was performed from the head to foot after the radiotracer. CT data was obtained and used for attenuation correction and anatomic localization. Fasting blood glucose: 147 mg/dl COMPARISON:  Chest CT 24/40/1027. Older CT examinations. Breast evaluation April 2025. FINDINGS: Mediastinal blood pool activity: SUV max 3.5 HEAD/NECK: No abnormal uptake seen along the head neck including along lymph node chains in the neck of the submandibular, posterior triangle or internal jugular regions. Near symmetric uptake of the intracranial compartment. Incidental CT findings: Mastoid air cells are clear. Moderate mucosal thickening of the right maxillary sinus. The right maxillary sinus is smaller than left. There is streak artifact related to the patient's dental hardware. The parotid glands, submandibular glands are unremarkable. There is a cystic lesion in the thyroid  measuring 2.5 cm. This has low-level uptake of maximum SUV of 3.3. Please correlate for any known history or prior workup. No midline shift, mass-effect hydrocephalus intracranially. Global preserved brain volume. CHEST: Hypermetabolic mass identified maximum SUV value 31.8 corresponding to the lesion along the anterior right chest wall involving the pectoralis muscle and the breast with a presumed biopsy clip in the central portion of the lesion. Lesion today measures 3.1 by 2.5 cm. Lesion abuts the adjacent rib but there is no cortical erosion seen today. No additional areas of abnormal radiotracer uptake identified in the thorax above blood pool including along axillary regions, hilum or mediastinum. No abnormal  lung uptake. Incidental CT findings: Scattered vascular calcifications. Heart is slightly enlarged. No pericardial effusion. Slightly patulous esophagus. There is some pleural thickening at the right lung base with some calcification. Social nodular opacity at the right lung base is again seen but does not show any abnormal uptake. Once again there are some scattered bilateral lung nodules identified as well. Please correlate with prior CT report. These do not show any abnormal uptake including along the dominant 7 mm left lower lobe lesion. This  is seen on CT image 76 today. No pleural effusion. ABDOMEN/PELVIS: There is physiologic distribution radiotracer along the parenchymal organs, bowel and renal collecting systems. Incidental CT findings: Previous cholecystectomy. Spleen has an AP length of 12.8 cm, upper limits of normal. Right kidney is surgically absent. There is a rim calcified lesion along the posterior aspect of the liver inferiorly measuring up to 3.7 cm. Area is photopenic. Parapelvic left-sided renal cysts. No left-sided renal or ureteral stones. Bowel is nondilated. Scattered colonic diverticula. There is an area of decompressed sigmoid colon with has some slight wall thickening and stranding and slight uptake on CT image 141. Please correlate for any symptoms of diverticulitis. Epigastric midline anterior abdominal wall fat containing hernia. Smaller fat containing hernias just caudal as well. Skeleton/extremities: Physiologic muscle uptake along the arms. No abnormal osseous uptake. There is a focus of increased uptake maximum SUV value 4.7 corresponding to a small soft tissue nodule medial to the left tibia on image 288 measuring 7 mm. Please correlate with direct visualization. An aggressive lesion is in the differential. This does have a differential however. Mild lower extremity soft tissue edema. IMPRESSION: Right-sided posterior breast, chest wall mass again identified and is hypermetabolic  distant with known neoplasm. There is a fusion marker. The lesion does involve the pectoralis muscle and abuts the adjacent rib but there was no rib erosion. Small hypermetabolic 7 mm lesion along the subdermal location of the left lower leg medial to the tibia. Please correlate with direct visualization. Neck aggressive lesion is in the differential. Several lung nodules identified. There is pleural thickening at the right lung base with some calcifications and adjacent nodular opacity. These areas do not show any abnormal uptake. Recommend continued CT surveillance. Isthmic nodule along the thyroid  gland with low-level uptake. Please correlate for any prior evaluation or dedicated thyroid  ultrasound when appropriate. Subtle area of wall thickening and trace stranding along the mid sigmoid colon where there are diverticula. There is some mild asymmetric uptake in this location. A subtle diverticulitis is not excluded. Please correlate with any clinical symptomatology. Follow-up study could be considered as well. Findings will be called to the ordering service by the Radiology physician assistant team. Electronically Signed   By: Adrianna Horde M.D.   On: 07/12/2023 11:30   US  RT BREAST BX W LOC DEV 1ST LESION IMG BX SPEC US  GUIDE Addendum Date: 07/10/2023 ADDENDUM REPORT: 07/10/2023 14:59 ADDENDUM: PATHOLOGY revealed: 1. Breast, right, needle core biopsy, 3:00 1 cmfn : MALIGNANT SARCOMATOUS NEOPLASM (SEE MICROSCOPIC COMMENT). SEE COMMENT: The differential diagnosis would include a sarcomatous component of a metaplastic carcinoma with unsampled epithelial component, leiomyosarcomatous transformation within a malignant phyllodes tumor or a de novo leiomyosarcoma. * Additional information in comment section. Pathology results are CONCORDANT with imaging findings, per Dr. Clancy Crimes. Pathology results and recommendations were discussed with patient via telephone on 07/10/2023 by Ladonna Pickup RN. Patient reported  biopsy site doing well with no adverse symptoms, and only slight tenderness at the site. Post biopsy care instructions were reviewed, questions were answered and my direct phone number was provided. Patient was instructed to call [facility] for any additional questions or concerns related to biopsy site. RECOMMENDATIONS: 1. Surgical and oncological consultation. Per patient request, oncologist (Dr. Adrian Alba) will refer patient to surgeon after discussing pathology results with patient; patient has appointment with oncologist on 07/11/2023. Ladonna Pickup RN sent epic in-basket to provider informing him of patient's request on 07/10/2023. 2. Consider pretreatment bilateral breast MRI with  and without contrast to assess extent of breast disease and concern for muscular invasion/sarcoma. Pathology results reported by Ladonna Pickup RN on 07/10/2023. Electronically Signed   By: Clancy Crimes M.D.   On: 07/10/2023 14:59   Result Date: 07/10/2023 CLINICAL DATA:  History of RIGHT breast DCIS in 2014. Newly identified suspicious mass on recent CT. EXAM: ULTRASOUND GUIDED RIGHT BREAST CORE NEEDLE BIOPSY COMPARISON:  Previous exam(s). PROCEDURE: I met with the patient and we discussed the procedure of ultrasound-guided biopsy, including benefits and alternatives. We discussed the high likelihood of a successful procedure. We discussed the risks of the procedure, including infection, bleeding, tissue injury, clip migration, and inadequate sampling. Informed written consent was given. The usual time-out protocol was performed immediately prior to the procedure. Lesion quadrant: Lower inner quadrant Using sterile technique and 1% lidocaine  and 1% lidocaine  with epinephrine  as local anesthetic, under direct ultrasound visualization, a 14 gauge spring-loaded device was used to perform biopsy of a mass at 3 o'clock using a medial approach. At the conclusion of the procedure a COIL shaped tissue marker clip was deployed into  the biopsy cavity. Placement was verified sonographically given this mass is not able to be visualized mammographically. IMPRESSION: Ultrasound guided biopsy of a RIGHT breast mass. No apparent complications. Electronically Signed: By: Clancy Crimes M.D. On: 07/05/2023 13:52   MM DIAG BREAST TOMO UNI RIGHT Result Date: 06/29/2023 CLINICAL DATA:  Status post RIGHT breast lumpectomy in 2014 for malignancy. Recent chest CT identified a soft tissue mass-like lesion in the right breast/right anterior chest wall. EXAM: DIGITAL DIAGNOSTIC UNILATERAL RIGHT MAMMOGRAM WITH TOMOSYNTHESIS AND CAD; ULTRASOUND RIGHT BREAST LIMITED TECHNIQUE: Right digital diagnostic mammography and breast tomosynthesis was performed. The images were evaluated with computer-aided detection. ; Targeted ultrasound examination of the right breast was performed COMPARISON:  Previous exam(s). ACR Breast Density Category c: The breasts are heterogeneously dense, which may obscure small masses. FINDINGS: Mammogram: There is a focal asymmetry with associated architectural distortion in the medial central left breast posterior depth. This is immediately subjacent to a scar marker and appears mammographically stable compared to numerous prior examinations, consistent with post lumpectomy changes. No definite correlate for the soft tissue mass-like lesion seen on CT is identified mammographically, likely due to the far posterior location. No new suspicious mass, calcification, or other findings are identified in the right breast. Ultrasound: Targeted right breast ultrasound was performed. At 3 o'clock 1 cm from the nipple, there is an oval hypoechoic mass with slightly indistinct margins. It measures 4.3 x 2.0 x 2.7 cm and there is no internal vascularity. This mass is located adjacent to visible scar related to patient's remote lumpectomy. This corresponds the soft tissue mass-like lesion seen on recent CT. IMPRESSION: RIGHT breast 4.3 cm hypoechoic  mass in the 3 o'clock position, adjacent to prior lumpectomy site, corresponds with the soft tissue mass seen on recent CT. Differential considerations include postoperative seroma, scar tissue, or recurrent malignancy. Recommend further assessment with ultrasound-guided biopsy. RECOMMENDATION: RIGHT breast ultrasound-guided biopsy (1 site). I have discussed the findings and recommendations with the patient. The biopsy procedure was discussed with the patient and questions were answered. Patient expressed their understanding of the biopsy recommendation. Patient will be scheduled for biopsy at her earliest convenience by the schedulers. Ordering provider will be notified. If applicable, a reminder letter will be sent to the patient regarding the next appointment. BI-RADS CATEGORY  4: Suspicious. Electronically Signed   By: Sande Cromer M.D.   On: 06/29/2023  11:40   US  LIMITED ULTRASOUND INCLUDING AXILLA RIGHT BREAST Result Date: 06/29/2023 CLINICAL DATA:  Status post RIGHT breast lumpectomy in 2014 for malignancy. Recent chest CT identified a soft tissue mass-like lesion in the right breast/right anterior chest wall. EXAM: DIGITAL DIAGNOSTIC UNILATERAL RIGHT MAMMOGRAM WITH TOMOSYNTHESIS AND CAD; ULTRASOUND RIGHT BREAST LIMITED TECHNIQUE: Right digital diagnostic mammography and breast tomosynthesis was performed. The images were evaluated with computer-aided detection. ; Targeted ultrasound examination of the right breast was performed COMPARISON:  Previous exam(s). ACR Breast Density Category c: The breasts are heterogeneously dense, which may obscure small masses. FINDINGS: Mammogram: There is a focal asymmetry with associated architectural distortion in the medial central left breast posterior depth. This is immediately subjacent to a scar marker and appears mammographically stable compared to numerous prior examinations, consistent with post lumpectomy changes. No definite correlate for the soft tissue  mass-like lesion seen on CT is identified mammographically, likely due to the far posterior location. No new suspicious mass, calcification, or other findings are identified in the right breast. Ultrasound: Targeted right breast ultrasound was performed. At 3 o'clock 1 cm from the nipple, there is an oval hypoechoic mass with slightly indistinct margins. It measures 4.3 x 2.0 x 2.7 cm and there is no internal vascularity. This mass is located adjacent to visible scar related to patient's remote lumpectomy. This corresponds the soft tissue mass-like lesion seen on recent CT. IMPRESSION: RIGHT breast 4.3 cm hypoechoic mass in the 3 o'clock position, adjacent to prior lumpectomy site, corresponds with the soft tissue mass seen on recent CT. Differential considerations include postoperative seroma, scar tissue, or recurrent malignancy. Recommend further assessment with ultrasound-guided biopsy. RECOMMENDATION: RIGHT breast ultrasound-guided biopsy (1 site). I have discussed the findings and recommendations with the patient. The biopsy procedure was discussed with the patient and questions were answered. Patient expressed their understanding of the biopsy recommendation. Patient will be scheduled for biopsy at her earliest convenience by the schedulers. Ordering provider will be notified. If applicable, a reminder letter will be sent to the patient regarding the next appointment. BI-RADS CATEGORY  4: Suspicious. Electronically Signed   By: Sande Cromer M.D.   On: 06/29/2023 11:40    ASSESSMENT: Malignant sarcomatous neoplasm.  PLAN:    Malignant sarcomatous neoplasm: CT scan results from June 13, 2023 reviewed independently and reported as above with a 2.2 x 2.0 x 2.9 right anterior chest wall mass incidentally noted on follow-up chest CT to assess for pulmonary nodule.  Patient has a history of DCIS in the right breast.  Mass was not noted on mammogram in July 2024.  Subsequent biopsy revealed the  above-stated malignancy.  PET scan results from July 12, 2023 reviewed independently and reported as above with significantly hypermetabolic chest wall mass with no other areas of suspicious hypermetabolism.  Patient will require surgical consult.  Is unclear if radiation or adjuvant XRT will be necessary.  Follow-up will be based on surgical recommendations.   Pulmonary nodules: These were not hypermetabolic.  Monitor. 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  She completed 5 years of tamoxifen  in August 2019.   I spent a total of 30 minutes reviewing chart data, face-to-face evaluation with the patient, counseling and coordination of care as detailed above.   Patient expressed understanding and was in agreement with this plan. She also understands that She can call clinic at any time with any questions, concerns, or complaints.    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

## 2023-07-12 ENCOUNTER — Ambulatory Visit
Admission: RE | Admit: 2023-07-12 | Discharge: 2023-07-12 | Disposition: A | Discharge status: Home / Self Care | Source: Ambulatory Visit | Attending: Oncology | Admitting: Oncology

## 2023-07-12 ENCOUNTER — Telehealth: Payer: Self-pay | Admitting: *Deleted

## 2023-07-12 DIAGNOSIS — R918 Other nonspecific abnormal finding of lung field: Secondary | ICD-10-CM | POA: Insufficient documentation

## 2023-07-12 DIAGNOSIS — C50911 Malignant neoplasm of unspecified site of right female breast: Secondary | ICD-10-CM | POA: Diagnosis present

## 2023-07-12 DIAGNOSIS — E041 Nontoxic single thyroid nodule: Secondary | ICD-10-CM | POA: Diagnosis not present

## 2023-07-12 DIAGNOSIS — M7989 Other specified soft tissue disorders: Secondary | ICD-10-CM | POA: Insufficient documentation

## 2023-07-12 DIAGNOSIS — R928 Other abnormal and inconclusive findings on diagnostic imaging of breast: Secondary | ICD-10-CM | POA: Insufficient documentation

## 2023-07-12 LAB — GLUCOSE, CAPILLARY: Glucose-Capillary: 147 mg/dL — ABNORMAL HIGH (ref 70–99)

## 2023-07-12 MED ORDER — FLUDEOXYGLUCOSE F - 18 (FDG) INJECTION
10.7000 | Freq: Once | INTRAVENOUS | Status: AC | PRN
Start: 2023-07-12 — End: 2023-07-12
  Administered 2023-07-12: 10.75 via INTRAVENOUS

## 2023-07-12 NOTE — Telephone Encounter (Signed)
 Dr. Venice Gillis  wanted finnegan the results of pet

## 2023-07-13 ENCOUNTER — Other Ambulatory Visit

## 2023-07-13 NOTE — Progress Notes (Signed)
MDT

## 2023-07-14 ENCOUNTER — Telehealth: Payer: Self-pay | Admitting: *Deleted

## 2023-07-14 NOTE — Telephone Encounter (Signed)
 RN placed call to patient regarding follow up plan discussed at tumor board. Patient was not at home but spoke with patients husband regarding recommendations. Patients husband request that he and patient are able to meet with Dr. Adrian Alba to discuss further prior to referral being placed to [facility]. Appointment scheduled for 4/28 @ 10:45. Patients husband verbalized understanding of appointment details.

## 2023-07-17 ENCOUNTER — Inpatient Hospital Stay (HOSPITAL_BASED_OUTPATIENT_CLINIC_OR_DEPARTMENT_OTHER): Admitting: Oncology

## 2023-07-17 VITALS — BP 142/64 | HR 70 | Temp 98.0°F | Resp 18 | Ht 65.0 in | Wt 208.0 lb

## 2023-07-17 DIAGNOSIS — C493 Malignant neoplasm of connective and soft tissue of thorax: Secondary | ICD-10-CM | POA: Diagnosis not present

## 2023-07-17 DIAGNOSIS — C499 Malignant neoplasm of connective and soft tissue, unspecified: Secondary | ICD-10-CM | POA: Diagnosis not present

## 2023-07-17 NOTE — Progress Notes (Signed)
 [facility]  [phone] [phone]) [phone]  ID: Gail Mccarthy OB: 08/13/1940  MR#: [id number]  DVV#:[id number]  Patient Care Team: Little Riff, MD as PCP - General (Internal Medicine) Shellie Dials, MD as Consulting Physician (Oncology)  CHIEF COMPLAINT: Malignant sarcomatous neoplasm.  INTERVAL HISTORY: Patient returns to clinic today for further evaluation and treatment planning.  She currently feels well and is asymptomatic.  She has no neurologic complaints.  She denies any recent fevers or illnesses.  She has a good appetite and denies weight loss.  She has no chest pain, shortness of breath, cough, or hemoptysis.  She denies any nausea, vomiting, constipation, or diarrhea.  She has no urinary complaints.  Patient offers no specific complaints today.  REVIEW OF SYSTEMS:   Review of Systems  Constitutional: Negative.  Negative for fever, malaise/fatigue and weight loss.  Respiratory: Negative.  Negative for cough, hemoptysis and shortness of breath.   Cardiovascular: Negative.  Negative for chest pain and leg swelling.  Gastrointestinal: Negative.  Negative for abdominal pain.  Genitourinary: Negative.  Negative for dysuria.  Musculoskeletal: Negative.  Negative for myalgias.  Skin: Negative.  Negative for rash.  Neurological:  Negative for dizziness, focal weakness, weakness and headaches.  Psychiatric/Behavioral: Negative.  The patient is not nervous/anxious.     As per HPI. Otherwise, a complete review of systems is negative.  PAST MEDICAL HISTORY: Past Medical History:  Diagnosis Date   Breast cancer (HCC) 2014   RT LUMPECTOMY   Cough    CHRONIC   Diabetes mellitus without complication (HCC)    no meds since 2012   Diverticulosis    Dizziness    MEDICINE RELATED   Dysrhythmia    GERD (gastroesophageal reflux disease)    Heart palpitations    Hepatic steatosis    High cholesterol    History of hiatal hernia     History of partial mastectomy of right breast    Hypertension    IBS (irritable bowel syndrome)    Liver mass    Motion sickness    cars   Neoplasm of kidney    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HCC)    Personal history of radiation therapy 2014   BREAST CA   Radiation 2014   BREAST CA   Renal cell carcinoma (HCC) 5005   Renal insufficiency     PAST SURGICAL HISTORY: Past Surgical History:  Procedure Laterality Date   APPENDECTOMY     BREAST BIOPSY Right 07/05/2023   US  RT BREAST BX W LOC DEV 1ST LESION IMG BX SPEC US  GUIDE 07/05/2023 [facility]   BREAST EXCISIONAL BIOPSY Right 08/20/2012   hx of breast ca but close .25mm margins radation no chemo   BREAST LUMPECTOMY Right 2014   BREAST CA   CATARACT EXTRACTION W/PHACO Right 10/27/2014   Procedure: CATARACT EXTRACTION PHACO AND INTRAOCULAR LENS PLACEMENT (IOC);  Surgeon: Steven Dingeldein, MD;  Location: [facility] ORS;  Service: Ophthalmology;  Laterality: Right;  US : 01:30.0 AP%: 25.7 CDE: 40.11 Fluid lot# [id number]   CATARACT EXTRACTION W/PHACO Left 10/31/2017   Procedure: CATARACT EXTRACTION PHACO AND INTRAOCULAR LENS PLACEMENT (IOC) LEFT;  Surgeon: Annell Kidney, MD;  Location: [facility];  Service: Ophthalmology;  Laterality: Left;   CHOLECYSTECTOMY     COLONOSCOPY WITH PROPOFOL  N/A 10/08/2014   Procedure: COLONOSCOPY WITH PROPOFOL ;  Surgeon: Cassie Click, MD;  Location: [facility] ENDOSCOPY;  Service: Endoscopy;  Laterality: N/A;   COLONOSCOPY WITH PROPOFOL  N/A 12/25/2019   Procedure: COLONOSCOPY WITH PROPOFOL ;  Surgeon: Marshall Skeeter, MD;  Location: [facility] ENDOSCOPY;  Service: Endoscopy;  Laterality: N/A;   ESOPHAGOGASTRODUODENOSCOPY (EGD) WITH PROPOFOL  N/A 10/08/2014   Procedure: ESOPHAGOGASTRODUODENOSCOPY (EGD) WITH PROPOFOL ;  Surgeon: Cassie Click, MD;  Location: [facility] ENDOSCOPY;  Service: Endoscopy;  Laterality: N/A;   EYE SURGERY     macular hole repair   LEG SURGERY Right    FX PINS,SCREWS   NEPHRECTOMT  Right 06/2003   REPAIR EXTENSOR TENDON WITH METATARSAL OSTEOTOMY AND OPEN REDUCTION IN Right 10/27/2022   Procedure: METATARSAL  OPEN REDUCTION INTERNAL FIXATION (ORIF) METATARSAL;  Surgeon: Pink Bridges, DPM;  Location: [facility];  Service: Orthopedics/Podiatry;  Laterality: Right;  Diabetic   TONSILLECTOMY      FAMILY HISTORY: Family History  Problem Relation Age of Onset   Diabetes Other    COPD Other    Colitis Other    Breast cancer Paternal Aunt     ADVANCED DIRECTIVES (Y/N):  N  HEALTH MAINTENANCE: Social History   Tobacco Use   Smoking status: Never   Smokeless tobacco: Never  Vaping Use   Vaping status: Never Used  Substance Use Topics   Alcohol use: No   Drug use: Never     Colonoscopy:  PAP:  Bone density:  Lipid panel:  Allergies  Allergen Reactions   Amoxicillin-Pot Clavulanate Nausea Only    Other Reaction(s): Other (see comments)   Cefdinir Nausea And Vomiting and Nausea Only   Clindamycin Nausea And Vomiting   Codeine Nausea Only    Other reaction(s): Other (See Comments)  Other Reaction: Not Assessed   Latex Rash    RAST NEGATIVE    Current Outpatient Medications  Medication Sig Dispense Refill   acetaminophen  (TYLENOL ) 500 MG tablet Take 1,000 mg by mouth every 6 (six) hours as needed.     albuterol (VENTOLIN HFA) 108 (90 Base) MCG/ACT inhaler Inhale 2 puffs into the lungs every 6 (six) hours as needed.     allopurinol (ZYLOPRIM) 100 MG tablet Take 200 mg by mouth daily.     amLODipine (NORVASC) 2.5 MG tablet   5   aspirin  81 MG tablet Take 1 tablet (81 mg total) by mouth 2 (two) times daily.     benazepril (LOTENSIN) 10 MG tablet Take 10 mg by mouth daily.     calcitRIOL (ROCALTROL) 0.25 MCG capsule Take 0.25 mcg by mouth daily.     Cholecalciferol (VITAMIN D) 2000 units tablet Take 2,000 Units by mouth daily.     docusate sodium (COLACE) 100 MG capsule Take 100 mg by mouth 2 (two) times daily as needed for mild constipation.      glipiZIDE (GLUCOTROL XL) 5 MG 24 hr tablet Take 5 mg by mouth daily with breakfast.     Multiple Vitamins-Minerals (PRESERVISION AREDS) TABS Take 60 mg by mouth 2 (two) times daily.     pantoprazole (PROTONIX) 40 MG tablet Take 40 mg by mouth daily.     rosuvastatin (CRESTOR) 10 MG tablet Take 10 mg by mouth daily.     sucralfate (CARAFATE) 1 G tablet Take 1 g by mouth 4 (four) times daily -  with meals and at bedtime.     triamterene-hydrochlorothiazide (MAXZIDE-25) 37.5-25 MG per tablet Take 1 tablet by mouth daily.     doxycycline (ADOXA) 100 MG tablet Take 100 mg by mouth 2 (two) times daily. (Patient not taking: Reported on 06/22/2023)     No current facility-administered medications for this visit.    OBJECTIVE: Vitals:  07/17/23 1028  BP: (!) 142/64  Pulse: 70  Resp: 18  Temp: 98 F (36.7 C)  SpO2: 100%     Body mass index is 34.61 kg/m.    ECOG FS:0 - Asymptomatic  General: Well-developed, well-nourished, no acute distress. Eyes: Pink conjunctiva, anicteric sclera. HEENT: Normocephalic, moist mucous membranes. Lungs: No audible wheezing or coughing. Heart: Regular rate and rhythm. Abdomen: Soft, nontender, no obvious distention. Musculoskeletal: No edema, cyanosis, or clubbing. Neuro: Alert, answering all questions appropriately. Cranial nerves grossly intact. Skin: No rashes or petechiae noted. Psych: Normal affect.  LAB RESULTS:  Lab Results  Component Value Date   NA 136 11/03/2022   K 3.9 11/03/2022   CL 107 11/03/2022   CO2 19 (L) 11/03/2022   GLUCOSE 176 (H) 11/03/2022   BUN 25 (H) 11/03/2022   CREATININE 1.05 (H) 11/03/2022   CALCIUM 9.5 11/03/2022   PROT 6.8 11/03/2022   ALBUMIN 3.5 11/03/2022   AST 15 11/03/2022   ALT 30 11/03/2022   ALKPHOS 203 (H) 11/03/2022   BILITOT 0.7 11/03/2022   GFRNONAA 54 (L) 11/03/2022    Lab Results  Component Value Date   WBC 16.4 (H) 11/03/2022   NEUTROABS 4.9 03/16/2016   HGB 13.4 11/03/2022   HCT 42.6  11/03/2022   MCV 83.2 11/03/2022   PLT 180 11/03/2022     STUDIES: NM PET Image Initial (PI) Whole Body Result Date: 07/12/2023 CLINICAL DATA:  Initial treatment strategy for malignant sarcomatous neoplasm. Right breast cancer. EXAM: NUCLEAR MEDICINE PET WHOLE BODY TECHNIQUE: 10.75 mCi F-18 FDG was injected intravenously. Full-ring PET imaging was performed from the head to foot after the radiotracer. CT data was obtained and used for attenuation correction and anatomic localization. Fasting blood glucose: 147 mg/dl COMPARISON:  Chest CT 16/12/9602. Older CT examinations. Breast evaluation April 2025. FINDINGS: Mediastinal blood pool activity: SUV max 3.5 HEAD/NECK: No abnormal uptake seen along the head neck including along lymph node chains in the neck of the submandibular, posterior triangle or internal jugular regions. Near symmetric uptake of the intracranial compartment. Incidental CT findings: Mastoid air cells are clear. Moderate mucosal thickening of the right maxillary sinus. The right maxillary sinus is smaller than left. There is streak artifact related to the patient's dental hardware. The parotid glands, submandibular glands are unremarkable. There is a cystic lesion in the thyroid  measuring 2.5 cm. This has low-level uptake of maximum SUV of 3.3. Please correlate for any known history or prior workup. No midline shift, mass-effect hydrocephalus intracranially. Global preserved brain volume. CHEST: Hypermetabolic mass identified maximum SUV value 31.8 corresponding to the lesion along the anterior right chest wall involving the pectoralis muscle and the breast with a presumed biopsy clip in the central portion of the lesion. Lesion today measures 3.1 by 2.5 cm. Lesion abuts the adjacent rib but there is no cortical erosion seen today. No additional areas of abnormal radiotracer uptake identified in the thorax above blood pool including along axillary regions, hilum or mediastinum. No abnormal  lung uptake. Incidental CT findings: Scattered vascular calcifications. Heart is slightly enlarged. No pericardial effusion. Slightly patulous esophagus. There is some pleural thickening at the right lung base with some calcification. Social nodular opacity at the right lung base is again seen but does not show any abnormal uptake. Once again there are some scattered bilateral lung nodules identified as well. Please correlate with prior CT report. These do not show any abnormal uptake including along the dominant 7 mm left lower lobe lesion. This  is seen on CT image 76 today. No pleural effusion. ABDOMEN/PELVIS: There is physiologic distribution radiotracer along the parenchymal organs, bowel and renal collecting systems. Incidental CT findings: Previous cholecystectomy. Spleen has an AP length of 12.8 cm, upper limits of normal. Right kidney is surgically absent. There is a rim calcified lesion along the posterior aspect of the liver inferiorly measuring up to 3.7 cm. Area is photopenic. Parapelvic left-sided renal cysts. No left-sided renal or ureteral stones. Bowel is nondilated. Scattered colonic diverticula. There is an area of decompressed sigmoid colon with has some slight wall thickening and stranding and slight uptake on CT image 141. Please correlate for any symptoms of diverticulitis. Epigastric midline anterior abdominal wall fat containing hernia. Smaller fat containing hernias just caudal as well. Skeleton/extremities: Physiologic muscle uptake along the arms. No abnormal osseous uptake. There is a focus of increased uptake maximum SUV value 4.7 corresponding to a small soft tissue nodule medial to the left tibia on image 288 measuring 7 mm. Please correlate with direct visualization. An aggressive lesion is in the differential. This does have a differential however. Mild lower extremity soft tissue edema. IMPRESSION: Right-sided posterior breast, chest wall mass again identified and is hypermetabolic  distant with known neoplasm. There is a fusion marker. The lesion does involve the pectoralis muscle and abuts the adjacent rib but there was no rib erosion. Small hypermetabolic 7 mm lesion along the subdermal location of the left lower leg medial to the tibia. Please correlate with direct visualization. Neck aggressive lesion is in the differential. Several lung nodules identified. There is pleural thickening at the right lung base with some calcifications and adjacent nodular opacity. These areas do not show any abnormal uptake. Recommend continued CT surveillance. Isthmic nodule along the thyroid  gland with low-level uptake. Please correlate for any prior evaluation or dedicated thyroid  ultrasound when appropriate. Subtle area of wall thickening and trace stranding along the mid sigmoid colon where there are diverticula. There is some mild asymmetric uptake in this location. A subtle diverticulitis is not excluded. Please correlate with any clinical symptomatology. Follow-up study could be considered as well. Findings will be called to the ordering service by the Radiology physician assistant team. Electronically Signed   By: Adrianna Horde M.D.   On: 07/12/2023 11:30   US  RT BREAST BX W LOC DEV 1ST LESION IMG BX SPEC US  GUIDE Addendum Date: 07/10/2023 ADDENDUM REPORT: 07/10/2023 14:59 ADDENDUM: PATHOLOGY revealed: 1. Breast, right, needle core biopsy, 3:00 1 cmfn : MALIGNANT SARCOMATOUS NEOPLASM (SEE MICROSCOPIC COMMENT). SEE COMMENT: The differential diagnosis would include a sarcomatous component of a metaplastic carcinoma with unsampled epithelial component, leiomyosarcomatous transformation within a malignant phyllodes tumor or a de novo leiomyosarcoma. * Additional information in comment section. Pathology results are CONCORDANT with imaging findings, per Dr. Clancy Crimes. Pathology results and recommendations were discussed with patient via telephone on 07/10/2023 by Ladonna Pickup RN. Patient reported  biopsy site doing well with no adverse symptoms, and only slight tenderness at the site. Post biopsy care instructions were reviewed, questions were answered and my direct phone number was provided. Patient was instructed to call [facility] for any additional questions or concerns related to biopsy site. RECOMMENDATIONS: 1. Surgical and oncological consultation. Per patient request, oncologist (Dr. Adrian Alba) will refer patient to surgeon after discussing pathology results with patient; patient has appointment with oncologist on 07/11/2023. Ladonna Pickup RN sent epic in-basket to provider informing him of patient's request on 07/10/2023. 2. Consider pretreatment bilateral breast MRI with  and without contrast to assess extent of breast disease and concern for muscular invasion/sarcoma. Pathology results reported by Ladonna Pickup RN on 07/10/2023. Electronically Signed   By: Clancy Crimes M.D.   On: 07/10/2023 14:59   Result Date: 07/10/2023 CLINICAL DATA:  History of RIGHT breast DCIS in 2014. Newly identified suspicious mass on recent CT. EXAM: ULTRASOUND GUIDED RIGHT BREAST CORE NEEDLE BIOPSY COMPARISON:  Previous exam(s). PROCEDURE: I met with the patient and we discussed the procedure of ultrasound-guided biopsy, including benefits and alternatives. We discussed the high likelihood of a successful procedure. We discussed the risks of the procedure, including infection, bleeding, tissue injury, clip migration, and inadequate sampling. Informed written consent was given. The usual time-out protocol was performed immediately prior to the procedure. Lesion quadrant: Lower inner quadrant Using sterile technique and 1% lidocaine  and 1% lidocaine  with epinephrine  as local anesthetic, under direct ultrasound visualization, a 14 gauge spring-loaded device was used to perform biopsy of a mass at 3 o'clock using a medial approach. At the conclusion of the procedure a COIL shaped tissue marker clip was deployed into  the biopsy cavity. Placement was verified sonographically given this mass is not able to be visualized mammographically. IMPRESSION: Ultrasound guided biopsy of a RIGHT breast mass. No apparent complications. Electronically Signed: By: Clancy Crimes M.D. On: 07/05/2023 13:52   MM DIAG BREAST TOMO UNI RIGHT Result Date: 06/29/2023 CLINICAL DATA:  Status post RIGHT breast lumpectomy in 2014 for malignancy. Recent chest CT identified a soft tissue mass-like lesion in the right breast/right anterior chest wall. EXAM: DIGITAL DIAGNOSTIC UNILATERAL RIGHT MAMMOGRAM WITH TOMOSYNTHESIS AND CAD; ULTRASOUND RIGHT BREAST LIMITED TECHNIQUE: Right digital diagnostic mammography and breast tomosynthesis was performed. The images were evaluated with computer-aided detection. ; Targeted ultrasound examination of the right breast was performed COMPARISON:  Previous exam(s). ACR Breast Density Category c: The breasts are heterogeneously dense, which may obscure small masses. FINDINGS: Mammogram: There is a focal asymmetry with associated architectural distortion in the medial central left breast posterior depth. This is immediately subjacent to a scar marker and appears mammographically stable compared to numerous prior examinations, consistent with post lumpectomy changes. No definite correlate for the soft tissue mass-like lesion seen on CT is identified mammographically, likely due to the far posterior location. No new suspicious mass, calcification, or other findings are identified in the right breast. Ultrasound: Targeted right breast ultrasound was performed. At 3 o'clock 1 cm from the nipple, there is an oval hypoechoic mass with slightly indistinct margins. It measures 4.3 x 2.0 x 2.7 cm and there is no internal vascularity. This mass is located adjacent to visible scar related to patient's remote lumpectomy. This corresponds the soft tissue mass-like lesion seen on recent CT. IMPRESSION: RIGHT breast 4.3 cm hypoechoic  mass in the 3 o'clock position, adjacent to prior lumpectomy site, corresponds with the soft tissue mass seen on recent CT. Differential considerations include postoperative seroma, scar tissue, or recurrent malignancy. Recommend further assessment with ultrasound-guided biopsy. RECOMMENDATION: RIGHT breast ultrasound-guided biopsy (1 site). I have discussed the findings and recommendations with the patient. The biopsy procedure was discussed with the patient and questions were answered. Patient expressed their understanding of the biopsy recommendation. Patient will be scheduled for biopsy at her earliest convenience by the schedulers. Ordering provider will be notified. If applicable, a reminder letter will be sent to the patient regarding the next appointment. BI-RADS CATEGORY  4: Suspicious. Electronically Signed   By: Sande Cromer M.D.   On: 06/29/2023  11:40   US  LIMITED ULTRASOUND INCLUDING AXILLA RIGHT BREAST Result Date: 06/29/2023 CLINICAL DATA:  Status post RIGHT breast lumpectomy in 2014 for malignancy. Recent chest CT identified a soft tissue mass-like lesion in the right breast/right anterior chest wall. EXAM: DIGITAL DIAGNOSTIC UNILATERAL RIGHT MAMMOGRAM WITH TOMOSYNTHESIS AND CAD; ULTRASOUND RIGHT BREAST LIMITED TECHNIQUE: Right digital diagnostic mammography and breast tomosynthesis was performed. The images were evaluated with computer-aided detection. ; Targeted ultrasound examination of the right breast was performed COMPARISON:  Previous exam(s). ACR Breast Density Category c: The breasts are heterogeneously dense, which may obscure small masses. FINDINGS: Mammogram: There is a focal asymmetry with associated architectural distortion in the medial central left breast posterior depth. This is immediately subjacent to a scar marker and appears mammographically stable compared to numerous prior examinations, consistent with post lumpectomy changes. No definite correlate for the soft tissue  mass-like lesion seen on CT is identified mammographically, likely due to the far posterior location. No new suspicious mass, calcification, or other findings are identified in the right breast. Ultrasound: Targeted right breast ultrasound was performed. At 3 o'clock 1 cm from the nipple, there is an oval hypoechoic mass with slightly indistinct margins. It measures 4.3 x 2.0 x 2.7 cm and there is no internal vascularity. This mass is located adjacent to visible scar related to patient's remote lumpectomy. This corresponds the soft tissue mass-like lesion seen on recent CT. IMPRESSION: RIGHT breast 4.3 cm hypoechoic mass in the 3 o'clock position, adjacent to prior lumpectomy site, corresponds with the soft tissue mass seen on recent CT. Differential considerations include postoperative seroma, scar tissue, or recurrent malignancy. Recommend further assessment with ultrasound-guided biopsy. RECOMMENDATION: RIGHT breast ultrasound-guided biopsy (1 site). I have discussed the findings and recommendations with the patient. The biopsy procedure was discussed with the patient and questions were answered. Patient expressed their understanding of the biopsy recommendation. Patient will be scheduled for biopsy at her earliest convenience by the schedulers. Ordering provider will be notified. If applicable, a reminder letter will be sent to the patient regarding the next appointment. BI-RADS CATEGORY  4: Suspicious. Electronically Signed   By: Sande Cromer M.D.   On: 06/29/2023 11:40    ASSESSMENT: Malignant sarcomatous neoplasm.  PLAN:    Malignant sarcomatous neoplasm: CT scan results from June 13, 2023 reviewed independently and reported as above with a 2.2 x 2.0 x 2.9 right anterior chest wall mass incidentally noted on follow-up chest CT to assess for pulmonary nodule.  Patient has a history of DCIS in the right breast.  Mass was not noted on mammogram in July 2024.  Subsequent biopsy revealed the  above-stated malignancy.  PET scan results from July 12, 2023 reviewed independently and reported as above with significantly hypermetabolic chest wall mass with no other areas of suspicious hypermetabolism.  A referral has been made to [facility] for surgical intervention.  It is unclear if patient will require adjuvant chemotherapy or XRT at this time.  Follow-up will be based on surgical oncology treatment and recommendations.   Pulmonary nodules: These were not hypermetabolic.  Monitor. 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  She completed 5 years of tamoxifen  in August 2019.  Left tibial hypermetabolism: Patient reports years ago her rheumatologist told her she had a "arthritic knot" in the area of her hypometabolism.  Unclear if this is a rheumatoid nodule or not.  No intervention needed.  Monitor. Right nephrectomy: Patient underwent right nephrectomy in 2005 for "renal neoplasm".  We do not have any further pathology of this lesion.   Patient expressed understanding and was in agreement with this plan. She also understands that She can call clinic at any time with any questions, concerns, or complaints.    Cancer Staging  Ductal carcinoma in situ (DCIS) of right breast Staging form: Breast, AJCC 7th Edition - Clinical stage from 09/21/2014: Stage 0 (Tis (DCIS), N0, M0) - Signed by Shellie Dials, MD on 09/21/2014 Laterality: Right Estrogen receptor status: Positive Progesterone receptor status: Positive  Sarcoma (HCC) Staging form: Soft Tissue Sarcoma, AJCC 7th Edition - Clinical stage from 07/17/2023: T1, N0, M0 - Signed by Shellie Dials, MD on 07/17/2023 Stage prefix: Initial diagnosis Source of metastatic specimen: Soft tissues (including muscle)   Shellie Dials, MD   07/17/2023 12:49 PM

## 2023-07-20 ENCOUNTER — Encounter: Payer: Self-pay | Admitting: Oncology

## 2023-07-26 ENCOUNTER — Inpatient Hospital Stay: Attending: Oncology

## 2023-07-26 DIAGNOSIS — C499 Malignant neoplasm of connective and soft tissue, unspecified: Secondary | ICD-10-CM

## 2023-07-26 NOTE — Progress Notes (Signed)
 Multidisciplinary Oncology Council Documentation  Gail Mccarthy was presented by our [facility] on 07/26/2023, which included representatives from:  Palliative Care Dietitian  Physical/Occupational Therapist Nurse Navigator Genetics Social work Survivorship RN Financial Navigator Research RN   Gail Mccarthy currently presents with history of sarcoma  We reviewed previous medical and familial history, history of present illness, and recent lab results along with all available histopathologic and imaging studies. The MOC considered available treatment options and made the following recommendations/referrals:  None  The MOC is a meeting of clinicians from various specialty areas who evaluate and discuss patients for whom a multidisciplinary approach is being considered. Final determinations in the plan of care are those of the provider(s).   Today's extended care, comprehensive team conference, Gail Mccarthy was not present for the discussion and was not examined.
